# Patient Record
Sex: Female | Born: 1990 | Race: White | Hispanic: No | Marital: Married | Smoking: Never smoker
Health system: Southern US, Community
[De-identification: ages and names within clinical notes are randomized; demographics above are authoritative.]

## PROBLEM LIST (undated history)

## (undated) DIAGNOSIS — N946 Dysmenorrhea, unspecified: Secondary | ICD-10-CM

## (undated) DIAGNOSIS — O99891 Other specified diseases and conditions complicating pregnancy: Secondary | ICD-10-CM

## (undated) DIAGNOSIS — S0990XA Unspecified injury of head, initial encounter: Secondary | ICD-10-CM

## (undated) DIAGNOSIS — N200 Calculus of kidney: Secondary | ICD-10-CM

## (undated) HISTORY — DX: Dysmenorrhea, unspecified: N94.6

---
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
## 2012-11-30 IMAGING — CR DG CERVICAL SPINE 2 OR 3 VIEWS
2 series · 2 of 2 positions shown · non-contrast
Comparison: None.

CLINICAL DATA: Neck pain, dizziness, headache

CERVICAL SPINE - 2-3 VIEW

[w c-spine lat]
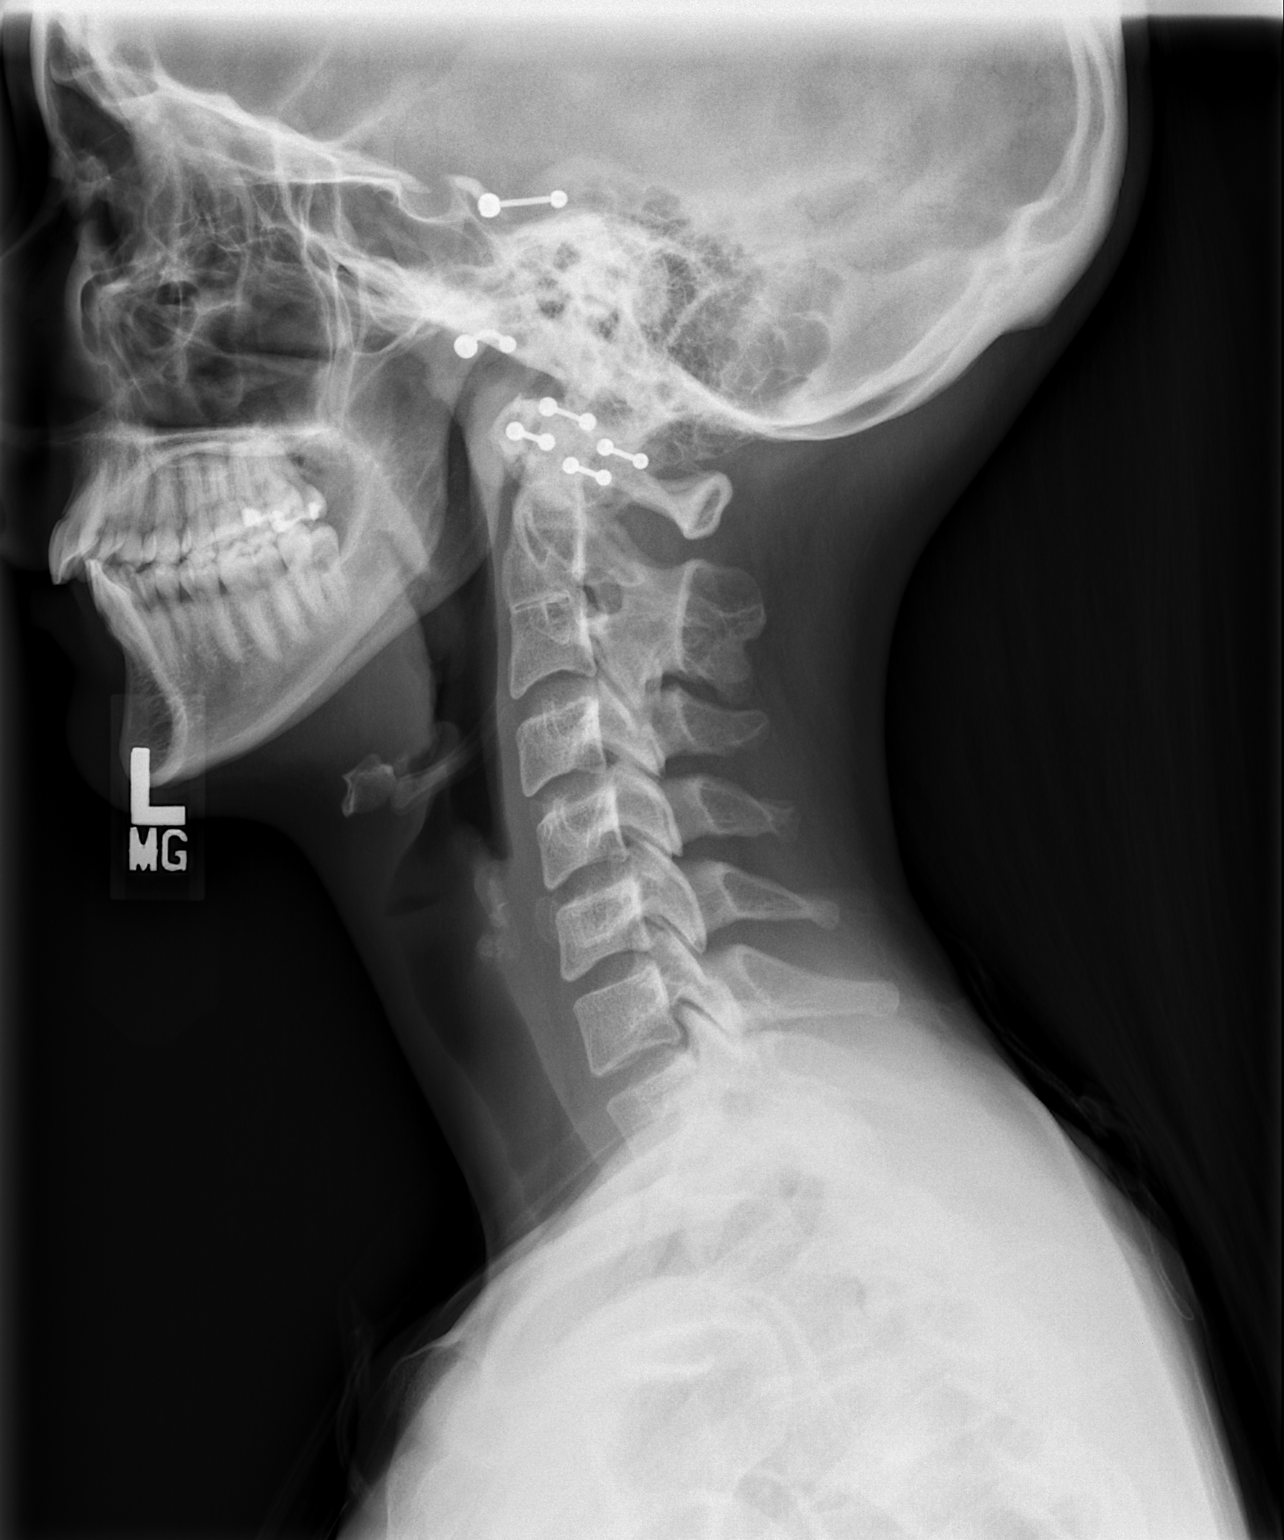

[w c-spine a.p. *]
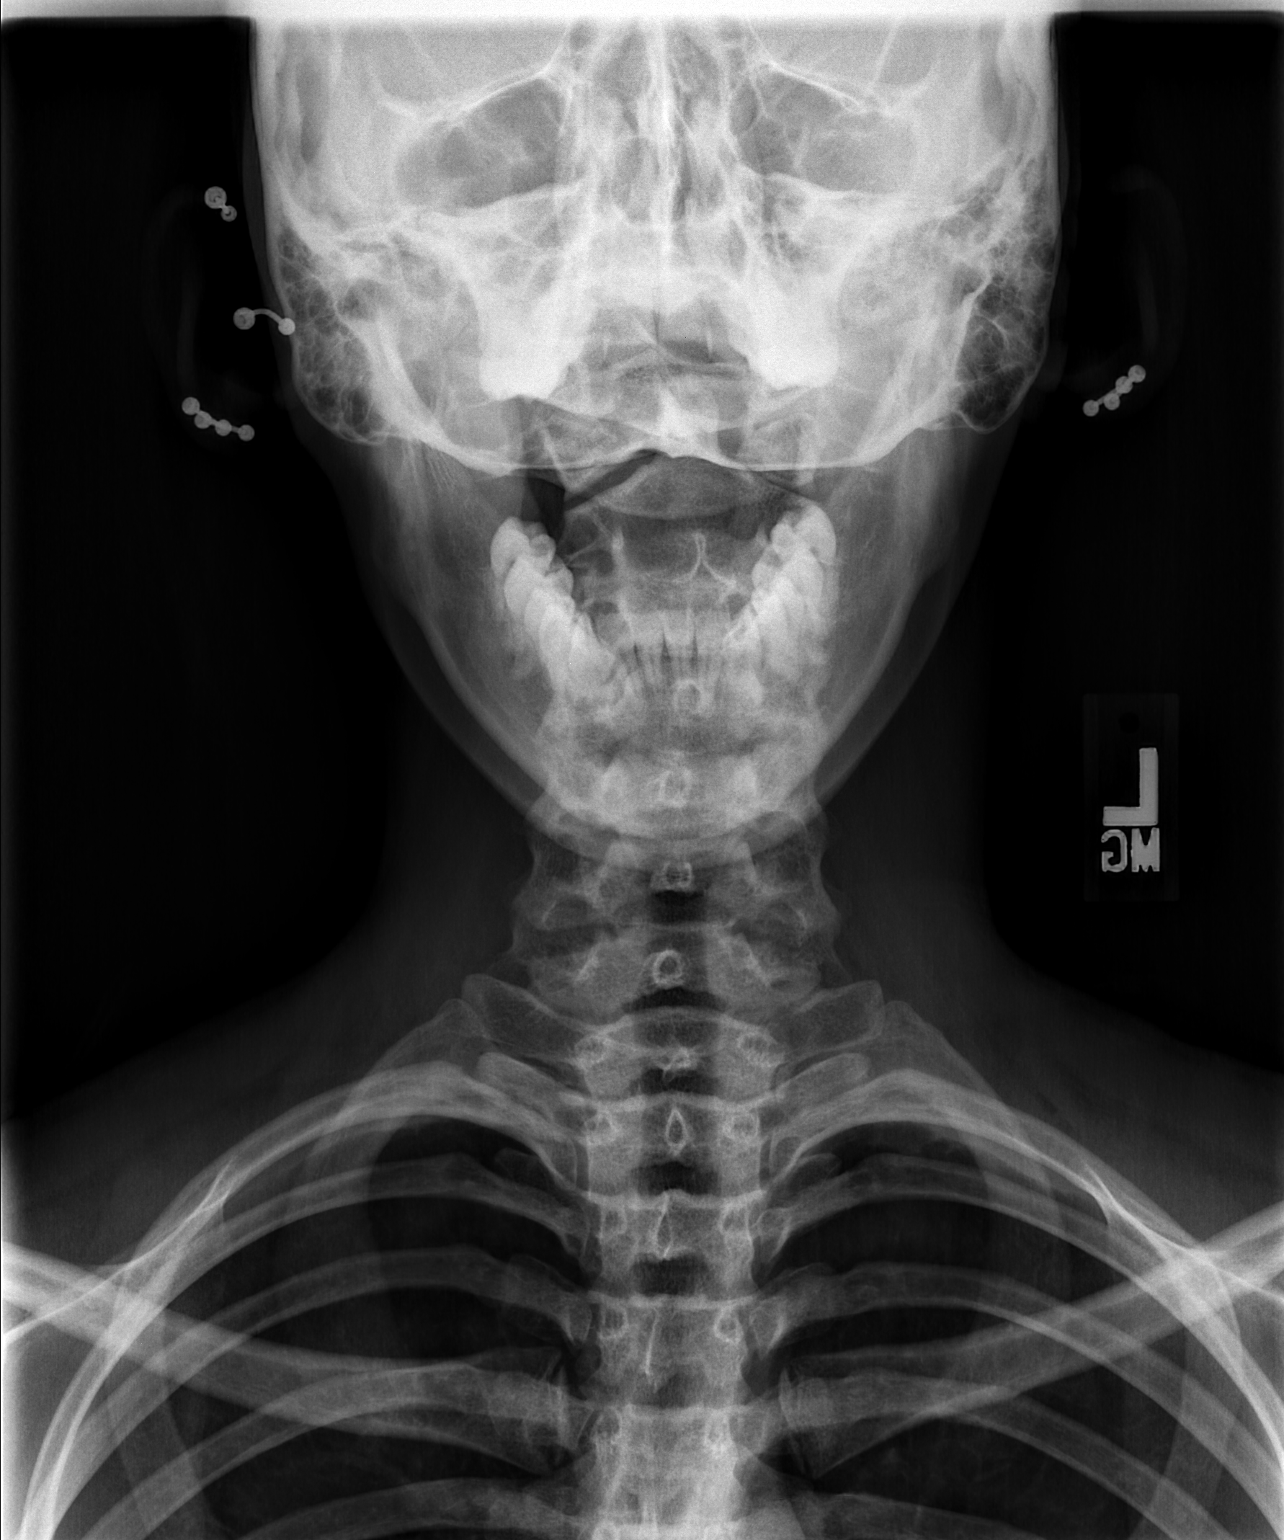

[2 of 2 positions shown; findings below may reference images not displayed]

FINDINGS: The cervical vertebrae are very slightly straightened in
alignment.  Intervertebral disc spaces appear normal.  No
prevertebral soft tissue swelling is seen.  There is apparent
congenital fusion of C2-3.  The lung apices are clear.
IMPRESSION: There is slightly straightened alignment with no acute abnormality.
Probable congenital fusion of C2-3.

## 2013-03-29 ENCOUNTER — Ambulatory Visit: Payer: 59 | Admitting: Sports Medicine

## 2013-11-23 ENCOUNTER — Encounter: Payer: Self-pay | Admitting: Women's Health

## 2013-12-06 ENCOUNTER — Other Ambulatory Visit (HOSPITAL_COMMUNITY)
Admission: RE | Admit: 2013-12-06 | Discharge: 2013-12-06 | Disposition: A | Discharge status: Home / Self Care | Payer: BC Managed Care – PPO | Source: Ambulatory Visit | Attending: Gynecology | Admitting: Gynecology

## 2013-12-06 ENCOUNTER — Encounter: Payer: Self-pay | Admitting: Women's Health

## 2013-12-06 ENCOUNTER — Ambulatory Visit (INDEPENDENT_AMBULATORY_CARE_PROVIDER_SITE_OTHER): Payer: BC Managed Care – PPO | Admitting: Women's Health

## 2013-12-06 VITALS — BP 112/72 | Ht 66.5 in | Wt 125.8 lb

## 2013-12-06 DIAGNOSIS — Z01419 Encounter for gynecological examination (general) (routine) without abnormal findings: Secondary | ICD-10-CM

## 2013-12-06 DIAGNOSIS — N946 Dysmenorrhea, unspecified: Secondary | ICD-10-CM

## 2013-12-06 DIAGNOSIS — Z3041 Encounter for surveillance of contraceptive pills: Secondary | ICD-10-CM

## 2013-12-06 LAB — CBC WITH DIFFERENTIAL/PLATELET
Basophils Absolute: 0 10*3/uL (ref 0.0–0.1)
Basophils Relative: 0 % (ref 0–1)
Eosinophils Absolute: 0.1 10*3/uL (ref 0.0–0.7)
Eosinophils Relative: 1 % (ref 0–5)
HEMATOCRIT: 36 % (ref 36.0–46.0)
HEMOGLOBIN: 12.2 g/dL (ref 12.0–15.0)
LYMPHS ABS: 1.7 10*3/uL (ref 0.7–4.0)
LYMPHS PCT: 19 % (ref 12–46)
MCH: 28.1 pg (ref 26.0–34.0)
MCHC: 33.9 g/dL (ref 30.0–36.0)
MCV: 82.9 fL (ref 78.0–100.0)
MONO ABS: 1 10*3/uL (ref 0.1–1.0)
MONOS PCT: 11 % (ref 3–12)
NEUTROS ABS: 6.2 10*3/uL (ref 1.7–7.7)
Neutrophils Relative %: 69 % (ref 43–77)
Platelets: 271 10*3/uL (ref 150–400)
RBC: 4.34 MIL/uL (ref 3.87–5.11)
RDW: 13.5 % (ref 11.5–15.5)
WBC: 9 10*3/uL (ref 4.0–10.5)

## 2013-12-06 MED ORDER — NORGESTIM-ETH ESTRAD TRIPHASIC 0.18/0.215/0.25 MG-35 MCG PO TABS: 1.0000 | ORAL_TABLET | Freq: Every day | ORAL | Status: DC

## 2013-12-06 NOTE — Progress Notes (Signed)
Judy Ramsey 03/01/1991 [id number]    History:    Presents for annual exam.  Regular monthly cycle with dysmenorrhea. Had been on Ortho Tri-Cyclen with good relief of dysmenorrhea but stopped. Gardasil series completed. Had been sexually active first partner both.  Past medical history, past surgical history, family history and social history were all reviewed and documented in the EPIC chart. Senior at [employer],youth ministry.   ROS:  A  12 point ROS was performed and pertinent positives and negatives are included.  Exam:  Filed Vitals:   12/06/13 0853  BP: 112/72    General appearance:  Normal Thyroid:  Symmetrical, normal in size, without palpable masses or nodularity. Respiratory  Auscultation:  Clear without wheezing or rhonchi Cardiovascular  Auscultation:  Regular rate, without rubs, murmurs or gallops  Edema/varicosities:  Not grossly evident Abdominal  Soft,nontender, without masses, guarding or rebound.  Liver/spleen:  No organomegaly noted  Hernia:  None appreciated  Skin  Inspection:  Grossly normal   Breasts: Examined lying and sitting.     Right: Without masses, retractions, discharge or axillary adenopathy.     Left: Without masses, retractions, discharge or axillary adenopathy. Gentitourinary   Inguinal/mons:  Normal without inguinal adenopathy  External genitalia:  Normal  BUS/Urethra/Skene's glands:  Normal  Vagina:  Normal  Cervix:  Normal  Uterus:   normal in size, shape and contour.  Midline and mobile  Adnexa/parametria:     Rt: Without masses or tenderness.   Lt: Without masses or tenderness.  Anus and perineum: Normal    Assessment/Plan:  23 y.o. SWF G0 for annual exam with dysmenorrhea.  Monthly cycle with dysmenorrhea  Plan: Options reviewed, Ortho Tri-Cyclen prescription, proper use, slight risk for blood clots and strokes reviewed. Start up instructions reviewed. Reviewed importance of condoms if sexually active. SBE's, regular  exercise, calcium rich diet, MVI daily encouraged. Campus safety reviewed. CBC, UA, Pap. New screening guidelines reviewed.  Note: This dictation was prepared with Dragon/digital dictation.  Any transcriptional errors that result are unintentional. Harrington ChallengerYOUNG,Judy J South Pointe Surgical CenterWHNP, 9:54 AM 12/06/2013

## 2013-12-06 NOTE — Patient Instructions (Signed)

## 2013-12-07 LAB — URINALYSIS W MICROSCOPIC + REFLEX CULTURE
BILIRUBIN URINE: NEGATIVE
CRYSTALS: NONE SEEN
Casts: NONE SEEN
GLUCOSE, UA: NEGATIVE mg/dL
Hgb urine dipstick: NEGATIVE
Ketones, ur: NEGATIVE mg/dL
Nitrite: NEGATIVE
PH: 5.5 (ref 5.0–8.0)
Protein, ur: NEGATIVE mg/dL
SPECIFIC GRAVITY, URINE: 1.025 (ref 1.005–1.030)
Urobilinogen, UA: 0.2 mg/dL (ref 0.0–1.0)

## 2013-12-07 LAB — CYTOLOGY - PAP

## 2013-12-08 LAB — URINE CULTURE
Colony Count: NO GROWTH
Organism ID, Bacteria: NO GROWTH

## 2014-05-15 ENCOUNTER — Other Ambulatory Visit: Payer: Self-pay

## 2014-05-15 DIAGNOSIS — Z3041 Encounter for surveillance of contraceptive pills: Secondary | ICD-10-CM

## 2014-05-15 MED ORDER — NORGESTIM-ETH ESTRAD TRIPHASIC 0.18/0.215/0.25 MG-35 MCG PO TABS: 1.0000 | ORAL_TABLET | Freq: Every day | ORAL | Status: DC

## 2014-11-13 ENCOUNTER — Other Ambulatory Visit: Payer: Self-pay

## 2014-11-13 DIAGNOSIS — Z3041 Encounter for surveillance of contraceptive pills: Secondary | ICD-10-CM

## 2014-11-13 MED ORDER — NORGESTIM-ETH ESTRAD TRIPHASIC 0.18/0.215/0.25 MG-35 MCG PO TABS: 1.0000 | ORAL_TABLET | Freq: Every day | ORAL | Status: DC

## 2015-02-02 ENCOUNTER — Other Ambulatory Visit: Payer: Self-pay

## 2015-02-02 DIAGNOSIS — Z3041 Encounter for surveillance of contraceptive pills: Secondary | ICD-10-CM

## 2015-02-05 ENCOUNTER — Other Ambulatory Visit: Payer: Self-pay

## 2015-02-05 DIAGNOSIS — Z3041 Encounter for surveillance of contraceptive pills: Secondary | ICD-10-CM

## 2015-03-12 ENCOUNTER — Other Ambulatory Visit: Payer: Self-pay | Admitting: *Deleted

## 2015-03-12 DIAGNOSIS — Z3041 Encounter for surveillance of contraceptive pills: Secondary | ICD-10-CM

## 2015-03-12 MED ORDER — NORGESTIM-ETH ESTRAD TRIPHASIC 0.18/0.215/0.25 MG-35 MCG PO TABS: 1.0000 | ORAL_TABLET | Freq: Every day | ORAL | Status: DC

## 2015-03-12 NOTE — Telephone Encounter (Signed)
Annual on 04/11/15

## 2015-04-11 ENCOUNTER — Encounter: Payer: Self-pay | Admitting: Women's Health

## 2018-12-06 LAB — OB RESULTS CONSOLE ABO/RH

## 2018-12-06 LAB — OB RESULTS CONSOLE ANTIBODY SCREEN: Antibody Screen: POSITIVE

## 2019-01-31 LAB — OB RESULTS CONSOLE HGB/HCT, BLOOD
HCT: 29 (ref 29–41)
Hemoglobin: 9.7

## 2019-01-31 LAB — OB RESULTS CONSOLE PLATELET COUNT: Platelets: 281

## 2019-02-28 LAB — OB RESULTS CONSOLE HIV ANTIBODY (ROUTINE TESTING): HIV: NONREACTIVE

## 2019-02-28 LAB — OB RESULTS CONSOLE PLATELET COUNT: Platelets: 272

## 2019-02-28 LAB — OB RESULTS CONSOLE RPR: RPR: NONREACTIVE

## 2019-02-28 LAB — OB RESULTS CONSOLE HGB/HCT, BLOOD
HCT: 30 (ref 29–41)
Hemoglobin: 10.2

## 2019-03-14 ENCOUNTER — Encounter: Payer: Self-pay | Admitting: General Practice

## 2019-03-22 ENCOUNTER — Encounter: Payer: Self-pay | Admitting: General Practice

## 2019-03-29 ENCOUNTER — Other Ambulatory Visit: Payer: Self-pay | Admitting: *Deleted

## 2019-03-29 ENCOUNTER — Encounter: Payer: Self-pay | Admitting: *Deleted

## 2019-03-29 DIAGNOSIS — Z3400 Encounter for supervision of normal first pregnancy, unspecified trimester: Secondary | ICD-10-CM | POA: Insufficient documentation

## 2019-03-29 HISTORY — DX: Encounter for supervision of normal first pregnancy, unspecified trimester: Z34.00

## 2019-03-30 ENCOUNTER — Ambulatory Visit (INDEPENDENT_AMBULATORY_CARE_PROVIDER_SITE_OTHER): Payer: 59 | Admitting: Certified Nurse Midwife

## 2019-03-30 ENCOUNTER — Encounter: Payer: Self-pay | Admitting: Certified Nurse Midwife

## 2019-03-30 ENCOUNTER — Other Ambulatory Visit: Payer: Self-pay

## 2019-03-30 DIAGNOSIS — Z3400 Encounter for supervision of normal first pregnancy, unspecified trimester: Secondary | ICD-10-CM

## 2019-03-30 DIAGNOSIS — O99013 Anemia complicating pregnancy, third trimester: Secondary | ICD-10-CM

## 2019-03-30 DIAGNOSIS — O99019 Anemia complicating pregnancy, unspecified trimester: Secondary | ICD-10-CM

## 2019-03-30 DIAGNOSIS — Z3A31 31 weeks gestation of pregnancy: Secondary | ICD-10-CM

## 2019-03-30 DIAGNOSIS — D509 Iron deficiency anemia, unspecified: Secondary | ICD-10-CM

## 2019-03-30 HISTORY — DX: Iron deficiency anemia, unspecified: D50.9

## 2019-03-30 HISTORY — DX: Anemia complicating pregnancy, unspecified trimester: O99.019

## 2019-03-30 NOTE — Patient Instructions (Signed)

## 2019-03-30 NOTE — Progress Notes (Signed)
History:   Judy Ramsey is a 28 y.o. G1P0 at 51w6dby early ultrasound being seen today for her first obstetrical visit. [facility] under media tab.  Her obstetrical history is significant for iron deficiency anemia during pregnancy. Patient does intend to breast feed. Pregnancy history fully reviewed.  Patient reports no complaints.  [place]. Prefers midwife and plans to have birth plan. She denies problems or complications during this pregnancy.      HISTORY: OB History  Gravida Para Term Preterm AB Living  1 0 0 0 0 0  SAB TAB Ectopic Multiple Live Births  0 0 0 0 0    # Outcome Date GA Lbr Len/2nd Weight Sex Delivery Anes PTL Lv  1 Current             Last pap smear was done 09/2018 and was normal  Past Medical History:  Diagnosis Date  . Dysmenorrhea   . Head injury   . Medical history non-contributory    Past Surgical History:  Procedure Laterality Date  . NO PAST SURGERIES     Family History  Problem Relation Age of Onset  . Hypertension Paternal Grandmother    Social History   Tobacco Use  . Smoking status: Never Smoker  . Smokeless tobacco: Never Used  Substance Use Topics  . Alcohol use: No  . Drug use: No   Allergies  Allergen Reactions  . Sulfa Antibiotics   . Ultram [Tramadol Hcl]    Current Outpatient Medications on File Prior to Visit  Medication Sig Dispense Refill  . amitriptyline (ELAVIL) 25 MG tablet Take 1 tablet (25 mg total) by mouth at bedtime. 15 tablet 0  . Norgestimate-Ethinyl Estradiol Triphasic (ORTHO TRI-CYCLEN, 28,) 0.18/0.215/0.25 MG-35 MCG tablet Take 1 tablet by mouth daily. 1 Package 0  . PredniSONE 5 MG KIT Take 1 kit (5 mg total) by mouth daily after breakfast. Prednisone 5 mg 6 day dosepack.  Take as directed. 1 kit 0   No current facility-administered medications on file prior to visit.      Review of Systems Pertinent items noted in HPI and remainder of comprehensive ROS otherwise negative. Physical Exam:   Vitals:   03/30/19 0844  BP: 109/66  Pulse: 94  Temp: 97.7 F (36.5 C)  Weight: 152 lb (68.9 kg)   Fetal Heart Rate (bpm): 141 Uterus:  Fundal Height: 29 cm  System: General: well-developed, well-nourished female in no acute distress   Breasts:  normal appearance, no masses or tenderness bilaterally   Skin: normal coloration and turgor, no rashes   Neurologic: oriented, normal, negative, normal mood   Extremities: normal strength, tone, and muscle mass, ROM of all joints is normal   HEENT PERRLA, extraocular movement intact and sclera clear   Mouth/Teeth mucous membranes moist, pharynx normal without lesions and dental hygiene good   Neck supple and no masses   Cardiovascular: regular rate and rhythm   Respiratory:  no respiratory distress, normal breath sounds   Abdomen: soft, gravid smaller for gestational age, non-tender; bowel sounds normal; no masses,  no organomegaly     Assessment:    Pregnancy: G1P0 Patient Active Problem List   Diagnosis Date Noted  . Iron deficiency anemia during pregnancy 03/30/2019  . Supervision of normal first pregnancy, antepartum 03/29/2019  . Dysmenorrhea 12/06/2013     Plan:  1. Supervision of normal first pregnancy, antepartum - Welcomed to practice and introduced self to patient. - Educated and discussed use of babyscripts app and taking BP weekly- BP cuff received from office today  - Discussed use of MyChart integration for prenatal appointments due to COVID-19- discussed future prenatal appointments and the timing of appointments  - Anticipatory guidance on upcoming appointments and what to expect with pregnancy over the next 4 weeks  - Educated and discussed BH contractions, warning signs and reasons to call office or present to MAU for evaluation  - Answered patient's questions  - Encouraged to bring birth  plan to next appointment  - Discussed pediatricians - patient wants more holistic practice that does not push vaccinations  - Enroll Patient in Babyscripts  2. Iron deficiency anemia during pregnancy - Currently taking OTC iron supplementation  - Hgb at 28 weeks according to prenatal records is 10.2 on 02/28/2019  - Patient reports taking [place] iron supplements     Prenatal records reviewed  Continue prenatal vitamins. Patient declined genetic screening  Anatomy US completed  Problem list reviewed and updated. The nature of [place] with multiple MDs and other Advanced Practice Providers was explained to patient; also emphasized that residents, students are part of our team. Routine obstetric precautions reviewed. Return in about 29 days (around 04/28/2019) for ROB/GBS.      Lajean Manes, [place] for [employer], [place]

## 2019-04-05 ENCOUNTER — Inpatient Hospital Stay (HOSPITAL_COMMUNITY)
Admission: AD | Admit: 2019-04-05 | Discharge: 2019-04-05 | Disposition: A | Discharge status: Home / Self Care | Payer: 59 | Attending: Obstetrics & Gynecology | Admitting: Obstetrics & Gynecology

## 2019-04-05 ENCOUNTER — Telehealth: Payer: Self-pay | Admitting: *Deleted

## 2019-04-05 ENCOUNTER — Encounter (HOSPITAL_COMMUNITY): Payer: Self-pay | Admitting: Obstetrics & Gynecology

## 2019-04-05 ENCOUNTER — Other Ambulatory Visit: Payer: Self-pay

## 2019-04-05 ENCOUNTER — Inpatient Hospital Stay (HOSPITAL_COMMUNITY): Payer: 59

## 2019-04-05 DIAGNOSIS — O26831 Pregnancy related renal disease, first trimester: Secondary | ICD-10-CM | POA: Diagnosis not present

## 2019-04-05 DIAGNOSIS — R109 Unspecified abdominal pain: Secondary | ICD-10-CM

## 2019-04-05 DIAGNOSIS — Z3A32 32 weeks gestation of pregnancy: Secondary | ICD-10-CM | POA: Diagnosis not present

## 2019-04-05 DIAGNOSIS — N200 Calculus of kidney: Secondary | ICD-10-CM

## 2019-04-05 DIAGNOSIS — M549 Dorsalgia, unspecified: Secondary | ICD-10-CM | POA: Insufficient documentation

## 2019-04-05 DIAGNOSIS — Z882 Allergy status to sulfonamides status: Secondary | ICD-10-CM | POA: Insufficient documentation

## 2019-04-05 DIAGNOSIS — Z881 Allergy status to other antibiotic agents status: Secondary | ICD-10-CM | POA: Insufficient documentation

## 2019-04-05 DIAGNOSIS — O26893 Other specified pregnancy related conditions, third trimester: Secondary | ICD-10-CM | POA: Diagnosis not present

## 2019-04-05 DIAGNOSIS — R112 Nausea with vomiting, unspecified: Secondary | ICD-10-CM | POA: Diagnosis not present

## 2019-04-05 DIAGNOSIS — N132 Hydronephrosis with renal and ureteral calculous obstruction: Secondary | ICD-10-CM | POA: Insufficient documentation

## 2019-04-05 DIAGNOSIS — O26833 Pregnancy related renal disease, third trimester: Secondary | ICD-10-CM | POA: Diagnosis not present

## 2019-04-05 LAB — URINALYSIS, ROUTINE W REFLEX MICROSCOPIC
Bilirubin Urine: NEGATIVE
Glucose, UA: NEGATIVE mg/dL
Hgb urine dipstick: NEGATIVE
Ketones, ur: >80 mg/dL — AB
Leukocytes,Ua: NEGATIVE
Nitrite: NEGATIVE
Protein, ur: NEGATIVE mg/dL
Specific Gravity, Urine: 1.025 (ref 1.005–1.030)
pH: 6.5 (ref 5.0–8.0)

## 2019-04-05 MED ORDER — PROMETHAZINE HCL 25 MG PO TABS: 25.0000 mg | ORAL_TABLET | Freq: Four times a day (QID) | ORAL | 0 refills | Status: DC | PRN

## 2019-04-05 MED ORDER — CYCLOBENZAPRINE HCL 10 MG PO TABS
10.0000 mg | ORAL_TABLET | Freq: Once | ORAL | Status: AC
  Administered 2019-04-05: 10 mg via ORAL
  Filled 2019-04-05: qty 1

## 2019-04-05 MED ORDER — OXYCODONE HCL 5 MG PO TABS: 5.0000 mg | ORAL_TABLET | Freq: Four times a day (QID) | ORAL | 0 refills | Status: AC | PRN

## 2019-04-05 NOTE — MAU Note (Signed)
Not enough urine for culture tube 

## 2019-04-05 NOTE — Discharge Instructions (Signed)

## 2019-04-05 NOTE — MAU Provider Note (Signed)
Chief Complaint:  Abdominal Pain, Back Pain, Emesis, and Nausea   First Provider Initiated Contact with Patient 04/05/19 1543     HPI: Judy Ramsey is a 28 y.o. G1P0 at 31w5dwho presents to maternity admissions reporting back pain & n/v. Reports left sided back pain since this morning. Initially pain was intermittent but over the last several hours pain has become constant and more severe. Pain does not radiate. Nothing makes better or worse. Since the pain became worse she has been vomiting. States she hasn't been able to keep down water or tylenol due to vomiting. Also has had intermittent abdominal cramping since severe back pain and vomiting. Denies fever/chills, dysuria, vaginal bleeding, hematuria, or LOF. Good fetal movement. No recent intercourse. Has had increased urinary urgency & hasn't been able to void much today.   Location: back Quality: cramping Severity: 9/10 in pain scale Duration: <1 day Timing: constant Modifying factors: none Associated signs and symptoms: vomiting  Pregnancy Course: [facility]  Past Medical History:  Diagnosis Date  . Dysmenorrhea   . Head injury    OB History  Gravida Para Term Preterm AB Living  1            SAB TAB Ectopic Multiple Live Births               # Outcome Date GA Lbr Len/2nd Weight Sex Delivery Anes PTL Lv  1 Current            Past Surgical History:  Procedure Laterality Date  . BUNIONECTOMY  2018   right foot   Family History  Problem Relation Age of Onset  . Hypertension Paternal Grandmother   . Hypertension Mother    Social History   Tobacco Use  . Smoking status: Never Smoker  . Smokeless tobacco: Never Used  Substance Use Topics  . Alcohol use: No  . Drug use: No   Allergies  Allergen Reactions  . Pumpkin Flavor Hives  . Sulfa Antibiotics Hives  . Ultram [Tramadol Hcl]    No medications prior to admission.    I have reviewed patient's Past Medical Hx, Surgical Hx, Family Hx, Social Hx, medications and  allergies.   ROS:  Review of Systems  Constitutional: Negative.   Gastrointestinal: Positive for abdominal pain, nausea and vomiting. Negative for constipation and diarrhea.  Genitourinary: Positive for decreased urine volume, flank pain and frequency. Negative for dysuria, hematuria, vaginal bleeding and vaginal discharge.  Musculoskeletal: Positive for back pain.    Physical Exam   Patient Vitals for the past 24 hrs:  BP Temp Temp src Pulse Resp SpO2 Weight  04/05/19 1849 120/71 98.6 F (37 C) Oral (!) 107 20 100 % --  04/05/19 1440 117/62 97.9 F (36.6 C) Oral (!) 108 18 100 % 68.6 kg    Constitutional: Well-developed, well-nourished female. Pt visibly uncomfortable.  Cardiovascular: normal rate & rhythm, no murmur Respiratory: normal effort, lung sounds clear throughout GI: Positive left CVAT. Abd soft, non-tender, gravid appropriate for gestational age. Pos BS x 4 MS: Extremities nontender, no edema, normal ROM Neurologic: Alert and oriented x 4.  GU:      Pelvic: NEFG, physiologic discharge, no blood, cervix clean.   Dilation: Closed Effacement (%): Thick Cervical Position: Middle Station: -3 Exam by:: EJorje GuildNP  NST:  Baseline: 135 bpm, Variability: Good {> 6 bpm), Accelerations: Reactive and Decelerations: Absent   Labs: Results for orders placed or performed during the hospital encounter of 04/05/19 (from the past 24  hour(s))  Urinalysis, Routine w reflex microscopic     Status: Abnormal   Collection Time: 04/05/19  3:02 PM  Result Value Ref Range   Color, Urine YELLOW YELLOW   APPearance CLEAR CLEAR   Specific Gravity, Urine 1.025 1.005 - 1.030   pH 6.5 5.0 - 8.0   Glucose, UA NEGATIVE NEGATIVE mg/dL   Hgb urine dipstick NEGATIVE NEGATIVE   Bilirubin Urine NEGATIVE NEGATIVE   Ketones, ur >80 (A) NEGATIVE mg/dL   Protein, ur NEGATIVE NEGATIVE mg/dL   Nitrite NEGATIVE NEGATIVE   Leukocytes,Ua NEGATIVE NEGATIVE    Imaging:  US Renal  Result Date:  04/05/2019 CLINICAL DATA:  Initial evaluation for acute left flank pain. Thirty-two weeks pregnant. EXAM: RENAL / URINARY TRACT ULTRASOUND COMPLETE COMPARISON:  None. FINDINGS: Right Kidney: Renal measurements: 10.5 x 6.0 x 6.4 cm = volume: 210.8 mL. Echogenicity within normal limits. Mild hydronephrosis. No visible nephrolithiasis. No focal renal mass. Left Kidney: Renal measurements: 11.2 x 5.4 x 5.9 cm = volume: 187.7 mL. Echogenicity within normal limits. Mild left hydronephrosis. 6 mm nonobstructive stone present at the lower pole. No focal renal mass. Bladder: Not visualized. Other: None. IMPRESSION: 1. Mild bilateral hydronephrosis, likely related to current pregnancy. 2. 6 mm nonobstructive left renal nephrolithiasis. Electronically Signed   By: Jeannine Boga M.D.   On: 04/05/2019 18:34    MAU Course: Orders Placed This Encounter  Procedures  . US Renal  . Urinalysis, Routine w reflex microscopic  . Discharge patient   Meds ordered this encounter  Medications  . cyclobenzaprine (FLEXERIL) tablet 10 mg  . promethazine (PHENERGAN) 25 MG tablet    Sig: Take 1 tablet (25 mg total) by mouth every 6 (six) hours as needed for nausea or vomiting.    Dispense:  30 tablet    Refill:  0    Order Specific Question:   Supervising Provider    Answer:   [place] Mode [[id number]]  . oxyCODONE (ROXICODONE) 5 MG immediate release tablet    Sig: Take 1 tablet (5 mg total) by mouth every 6 (six) hours as needed for up to 5 days for breakthrough pain.    Dispense:  20 tablet    Refill:  0    Order Specific Question:   Supervising Provider    Answer:   [place] Mode [[id number]]    MDM: Reactive NST with some contractions. Cervix closed/thick  VSS & pt afebrile. Some CVAT on left side. U/a negative for signs of infection & no hemoglobin. Does appear dehydrated.  Offered pain medication & IV fluids. Pt declines but ultimately accepted some flexeril & attempted PO hydration. Continues to have severe  pain, will get renal ultrasound.   Renal ultrasound shows 6 mm left stone. Mild hydronephrosis bilaterally. No evidence of obstruction.       Discussed pain management at home. Push fluids. F/u with urology. Will defer flomax at this time due to sulfa allergy.                                   Assessment: 1. Pregnancy with nephrolithiasis in third trimester   2. Acute left flank pain   3. [redacted] weeks gestation of pregnancy     Plan: Discharge home in stable condition.  Rx phenergan & oxycodone Take tylenol prn Oral hydration F/u with urology Discussed reasons to return to MAU  Follow-up [facility].  Schedule an appointment as soon as possible for a visit.   Contact information: [facility] Follow up.   Specialty: Obstetrics and Gynecology Why: return for worsening symptoms Contact information: [address] [id number] [place] [phone]          Allergies as of 04/05/2019      Reactions   Pumpkin Flavor Hives   Sulfa Antibiotics Hives   Ultram [tramadol Hcl]       Medication List    STOP taking these medications   amitriptyline 25 MG tablet Commonly known as: ELAVIL   Norgestimate-Ethinyl Estradiol Triphasic 0.18/0.215/0.25 MG-35 MCG tablet Commonly known as: Ortho Tri-Cyclen (28)   PredniSONE 5 MG Kit     TAKE these medications   oxyCODONE 5 MG immediate release tablet Commonly known as: Roxicodone Take 1 tablet (5 mg total) by mouth every 6 (six) hours as needed for up to 5 days for breakthrough pain.   prenatal multivitamin Tabs tablet Take 1 tablet by mouth daily at 12 noon.   promethazine 25 MG tablet Commonly known as: PHENERGAN Take 1 tablet (25 mg total) by mouth every 6 (six) hours as needed for nausea or vomiting.       Jorje Guild, NP 04/05/2019 7:54 PM

## 2019-04-05 NOTE — Telephone Encounter (Signed)
Patient called stating she is having a lot a back pain that is getting worse. Patient stated she sleeps on her left side a lot and has not tried taking Tylenol. Advised patient to try extra strength Tylenol, taking a warm bath with epsom salt, using a body pillow at night, alternating right/left side sleeping and using a maternity support belt during the day. Patient voiced understanding.  Derl Barrow, RN

## 2019-04-05 NOTE — Telephone Encounter (Signed)
Patient's husband called clinic stating that patient is having severe back pain and now vomiting. Advised him to take patient to MAU for further evaluation.  Derl Barrow, RN

## 2019-04-05 NOTE — MAU Note (Signed)
Woke up fine, then started getting a cramp in lower left back, getting worse, is now a constant pain, has started cramping in abd every once in a while.  Pain is making her nauseated.

## 2019-04-08 NOTE — Telephone Encounter (Signed)
Patient called with concerns of having constipation and pain related to kidney stone. Pt stated she has not had a bowel movement in about 6 days. She also was starting to vomit again. Asked if the phenergan helped, she said she took the medication about 20 minutes to calling the office. I asked if MAU gave her a referral to call a urologist. She stated she was told not to call the urologist until after the pregnancy. Advised patient to give them a call anyway to see what options they have for her. She stated she tried the other options for constipation but didn't work. Advised patient to try Dulcolax suppository. If she stills severe pain, no bowel movement and vomiting to go back to MAU. Will forward to midwife for further advise.  Derl Barrow, RN

## 2019-04-09 ENCOUNTER — Other Ambulatory Visit: Payer: Self-pay | Admitting: Certified Nurse Midwife

## 2019-04-09 DIAGNOSIS — O219 Vomiting of pregnancy, unspecified: Secondary | ICD-10-CM

## 2019-04-09 DIAGNOSIS — O26833 Pregnancy related renal disease, third trimester: Secondary | ICD-10-CM

## 2019-04-09 DIAGNOSIS — N200 Calculus of kidney: Secondary | ICD-10-CM

## 2019-04-09 MED ORDER — ONDANSETRON 4 MG PO TBDP: 4.0000 mg | ORAL_TABLET | Freq: Three times a day (TID) | ORAL | 1 refills | Status: DC | PRN

## 2019-04-22 NOTE — L&D Delivery Note (Addendum)
OB/GYN Faculty Practice Delivery Note  Judy Ramsey is a 29 y.o. G1P0 s/p SVD at [redacted]w[redacted]d. She was admitted for PROM.   ROM: 52h 5m with clear fluid GBS Status:  Negative/-- (01/14 1111)  Labor Progress: Patient presented to L&D for PROM. Initial SVE: 1cm. She then progressed to complete.   Delivery Date/Time: 05/15/2019 @ 2042 Delivery: Called to room and patient was complete and pushing. Head delivered without difficulty. Nuchal cord present x 1. Shoulder delivery with gentle downward traction and body delivered in usual fashion. Infant with spontaneous cry, placed on mother's abdomen, dried and stimulated. Cord clamped x 2 after 1-minute delay, and cut by FOB. Cord blood drawn. Placenta delivered spontaneously with gentle cord traction. Fundus firm with massage and Pitocin. Labia, perineum, vagina, and cervix inspected inspected, first degree vaginal laceration present and repair with 3-0 vicryl.  Mother and baby stable and bonding.   Placenta: Sent to L&D Complications: None Lacerations: first degree vaginal EBL: 200 Analgesia: None   Infant: APGAR (1 MIN): 8   APGAR (5 MINS): 9    Sherlyn Lees, SNM, RNC-OB    I confirm that I have verified the information documented in the nurse midwife student's note and that I have also personally reperformed the history, physical exam and all medical decision making activities of this service and have verified that all service and findings are accurately documented in this student's note.   I was gloved and present for entire delivery Delivery was uneventful and tolerated well by patient No difficulty with shoulders Placenta delivered spontaneously and grossly intact Perineum as described above. Suturing indicated as above. Agree with delivery note above.   Raelyn Mora, CNM 05/15/2019 9:16 PM

## 2019-04-27 ENCOUNTER — Ambulatory Visit (INDEPENDENT_AMBULATORY_CARE_PROVIDER_SITE_OTHER): Payer: 59

## 2019-04-27 ENCOUNTER — Other Ambulatory Visit: Payer: Self-pay

## 2019-04-27 VITALS — BP 108/73 | HR 73 | Wt 155.2 lb

## 2019-04-27 DIAGNOSIS — Z3400 Encounter for supervision of normal first pregnancy, unspecified trimester: Secondary | ICD-10-CM

## 2019-04-27 DIAGNOSIS — Z3403 Encounter for supervision of normal first pregnancy, third trimester: Secondary | ICD-10-CM

## 2019-04-27 DIAGNOSIS — Z3A35 35 weeks gestation of pregnancy: Secondary | ICD-10-CM

## 2019-04-27 NOTE — Patient Instructions (Signed)
Places to have your son circumcised:                                                                      [facility]     [phone]   $480 while you are in hospital         [facility]              [phone]   $[phone] by 4 wks                      [facility]                     [phone]   $[phone] by 7 days [facility]                    [phone]   $269 by 4 wks [facility]             [phone]   $225 by 2 wks    These prices sometimes change but are roughly what you can expect to pay. Please call and confirm pricing.   Circumcision is considered an elective/non-medically necessary procedure. There are many reasons parents decide to have their sons circumsized. During the first year of life circumcised males have a reduced risk of urinary tract infections but after this year the rates between circumcised males and uncircumcised males are the same.  It is safe to have your son circumcised outside of the hospital and the places above perform them regularly.   Deciding about Circumcision in Baby Boys  (Up-to-date The Basics)  What is circumcision?   Circumcision is a surgery that removes the skin that covers the tip of the penis, called the "foreskin" Circumcision is usually done when a boy is between 1 and 10 days old. In the [place], circumcision is common. In some other countries, fewer boys are circumcised. Circumcision is a common tradition in some religions.  Should I have my baby boy circumcised?   There is no easy answer. Circumcision has some benefits. But it also has risks. After talking with your doctor, you will have to decide for yourself what is right for your family.  What are the benefits of circumcision?   Circumcised boys seem to have slightly lower rates of: ?Urinary tract infections ?Swelling of the opening at the tip of the penis Circumcised men seem to have slightly lower rates of: ?Urinary tract infections ?Swelling of the opening at the tip of the penis ?Penis cancer ?HIV  and other infections that you catch during sex ?Cervical cancer in the women they have sex with Even so, in the [place], the risks of these problems are small - even in boys and men who have not been circumcised. Plus, boys and men who are not circumcised can reduce these extra risks by: ?Cleaning their penis well ?Using condoms during sex  What are the risks of circumcision?  Risks include: ?Bleeding or infection from the surgery ?Damage to or amputation of the penis ?A chance that the doctor will cut off too much or not enough of the foreskin ?A chance that sex won't feel as good later in life Only about 1 out of every 200   circumcisions leads to problems. There is also a chance that your health insurance won't pay for circumcision.  How is circumcision done in baby boys?  First, the baby gets medicine for pain relief. This might be a cream on the skin or a shot into the base of the penis. Next, the doctor cleans the baby's penis well. Then he or she uses special tools to cut off the foreskin. Finally, the doctor wraps a bandage (called gauze) around the baby's penis. If you have your baby circumcised, his doctor or nurse will give you instructions on how to care for him after the surgery. It is important that you follow those instructions carefully.  

## 2019-04-27 NOTE — Progress Notes (Signed)
   PRENATAL VISIT NOTE  Subjective:  Judy Ramsey is a 29 y.o. G1P0 at [redacted]w[redacted]d who presents today for routine prenatal care.  She is currently being monitored for supervision of a low-risk pregnancy with problems as listed below.  Patient has no pregnancy related concerns and endorses fetal movement.  She denies vaginal concerns including discharge, bleeding, leaking, itching, and burning. She reports some "cramping" type pain that she feels may be contractions.  She further reports that the pain from her kidney stone subsided ~ 4 days after diagnosis.  However, patient is unsure of whether or not she has passed the stone.  She denies issues with urination.  Patient does not desire contraception and has secured a pediatrician who is "laxed" with vaccinations.  Patient does desire a circumcision for her infant son.    Patient Active Problem List   Diagnosis Date Noted  . Iron deficiency anemia during pregnancy 03/30/2019  . Supervision of normal first pregnancy, antepartum 03/29/2019  . Dysmenorrhea 12/06/2013    The following portions of the patient's history were reviewed and updated as appropriate: allergies, current medications, past family history, past medical history, past social history, past surgical history and problem list. Problem list updated.  Objective:   Vitals:   04/27/19 1342  BP: 108/73  Pulse: 73  Weight: 155 lb 3.2 oz (70.4 kg)    Fetal Status: Fetal Heart Rate (bpm): 140 Fundal Height: 34 cm Movement: Present     General:  Alert, oriented and cooperative. Patient is in no acute distress.  Skin: Skin is warm and dry.   Cardiovascular: Regular rate and rhythm.  Respiratory: Normal respiratory effort. CTA-Bilaterally  Abdomen: Soft, gravid, appropriate for gestational age.  Pelvic: Cervical exam deferred        Extremities: Normal range of motion.  Edema: Trace  Mental Status: Normal mood and affect. Normal behavior. Normal judgment and thought content.    Assessment and Plan:  Pregnancy: G1P0 at [redacted]w[redacted]d  1. Supervision of normal first pregnancy, antepartum -Reviewed current GA and discussed how it would be beneficial to receive GBS culture at later time as first time pregnancy likely to go over due date.  -Informed that cervical exam would be deferred due to GA. -Educated on inability to find Hepatitis B testing in previous ob records and will collect today. -Discussed releasing of results to mychart. -Discussed and reviewed postpartum planning including contraception, pediatricians, and infant feedings and circumcision desires/costs. -Extensive discussion regarding birth plans including standardization of delayed cord clamping and need to sign release for refusal of Vitamin K, Erythromycin, and Hep B for infant.  Further informed that some MDs may defer circumcision if Vitamin K is not administered.   -Encouraged to formulate birth plan that acts as a reminder of patient's desire that she and husband can refer to during stress of labor and delivery. -Plan to return to office in one week for GBS with cervical exam if desired.   Preterm labor symptoms and general obstetric precautions including but not limited to vaginal bleeding, contractions, leaking of fluid and fetal movement were reviewed with the patient.  Please refer to After Visit Summary for other counseling recommendations.  Return in about 1 week (around 05/04/2019) for LR-ROB with GBS.  Future Appointments  Date Time Provider Department Center  05/05/2019 10:10 AM Raelyn Mora, CNM [facility] None    Cherre Robins, CNM 04/27/2019, 2:29 PM

## 2019-04-27 NOTE — Progress Notes (Signed)
36 week labs at next appointment

## 2019-04-28 LAB — HEPATITIS B SURFACE ANTIGEN: Hepatitis B Surface Ag: NEGATIVE

## 2019-05-03 ENCOUNTER — Telehealth: Payer: Self-pay | Admitting: General Practice

## 2019-05-03 NOTE — Telephone Encounter (Signed)
Called [facility] to check on status of referral.  Patient was left a couple of messages to return call to schedule.  Patient has not returned the referred office to schedule appointment.

## 2019-05-05 ENCOUNTER — Other Ambulatory Visit: Payer: Self-pay

## 2019-05-05 ENCOUNTER — Ambulatory Visit (INDEPENDENT_AMBULATORY_CARE_PROVIDER_SITE_OTHER): Payer: 59 | Admitting: Obstetrics and Gynecology

## 2019-05-05 ENCOUNTER — Encounter: Payer: Self-pay | Admitting: Obstetrics and Gynecology

## 2019-05-05 VITALS — BP 112/79 | HR 97 | Temp 98.0°F | Wt 157.0 lb

## 2019-05-05 DIAGNOSIS — O1203 Gestational edema, third trimester: Secondary | ICD-10-CM

## 2019-05-05 DIAGNOSIS — N898 Other specified noninflammatory disorders of vagina: Secondary | ICD-10-CM

## 2019-05-05 DIAGNOSIS — Z113 Encounter for screening for infections with a predominantly sexual mode of transmission: Secondary | ICD-10-CM | POA: Diagnosis not present

## 2019-05-05 DIAGNOSIS — Z3A37 37 weeks gestation of pregnancy: Secondary | ICD-10-CM

## 2019-05-05 DIAGNOSIS — Z3400 Encounter for supervision of normal first pregnancy, unspecified trimester: Secondary | ICD-10-CM

## 2019-05-05 LAB — OB RESULTS CONSOLE GBS: GBS: NEGATIVE

## 2019-05-05 NOTE — Progress Notes (Signed)
   LOW-RISK PREGNANCY OFFICE VISIT Patient name: Samaya Boardley MRN [id number]  Date of birth: 02-04-1991 Chief Complaint:   No chief complaint on file.  History of Present Illness:   Cordella Nyquist is a 29 y.o. G1P0 female at [redacted]w[redacted]d with an Estimated Date of Delivery: 05/26/19 being seen today for ongoing management of a low-risk pregnancy.  Today she reports increased swelling of ankles and feet bilaterally. Contractions: Not present. Vag. Bleeding: None.  Movement: Present. denies leaking of fluid. Review of Systems:   Pertinent items are noted in HPI Denies abnormal vaginal discharge w/ itching/odor/irritation, headaches, visual changes, shortness of breath, chest pain, abdominal pain, severe nausea/vomiting, or problems with urination or bowel movements unless otherwise stated above. Pertinent History Reviewed:  Reviewed past medical,surgical, social, obstetrical and family history.  Reviewed problem list, medications and allergies. Physical Assessment:   Vitals:   05/05/19 1026  BP: 112/79  Pulse: 97  Temp: 98 F (36.7 C)  Weight: 157 lb (71.2 kg)  Body mass index is 24.96 kg/m.        Physical Examination:   General appearance: Well appearing, and in no distress  Mental status: Alert, oriented to person, place, and time  Skin: Warm & dry  Cardiovascular: Normal heart rate noted  Respiratory: Normal respiratory effort, no distress  Abdomen: Soft, gravid, nontender  Pelvic: Cervical exam performed  Dilation: Closed Effacement (%): Thick Station: -3  Extremities: Edema: Mild pitting, slight indentation  Fetal Status: Fetal Heart Rate (bpm): 145 Fundal Height: 37 cm Movement: Present Presentation: Vertex  No results found for this or any previous visit (from the past 24 hour(s)).  Assessment & Plan:  1) Low-risk pregnancy G1P0 at [redacted]w[redacted]d with an Estimated Date of Delivery: 05/26/19   2) Supervision of normal first pregnancy, antepartum  - Culture, beta strep (group b only),  -  Cervicovaginal ancillary only( [place])  3) Edema during pregnancy in third trimester  - Advised to increase daily water intake and elevate extremities anytime she is sitting (with the exception of driving)   Meds: No orders of the defined types were placed in this encounter.  Labs/procedures today: GBS and GC/CT  Plan:  Continue routine obstetrical care   Reviewed: Term labor symptoms and general obstetric precautions including but not limited to vaginal bleeding, contractions, leaking of fluid and fetal movement were reviewed in detail with the patient.  All questions were answered. Has home bp cuff. Check bp weekly, let us know if >140/90.   Follow-up: Return in about 1 week (around 05/12/2019) for Return OB - My Chart video.  Orders Placed This Encounter  Procedures  . Culture, beta strep (group b only)   Raelyn Mora MSN, CNM 05/05/2019 11:01 AM

## 2019-05-06 LAB — CERVICOVAGINAL ANCILLARY ONLY
Bacterial Vaginitis (gardnerella): NEGATIVE
Candida Glabrata: NEGATIVE
Candida Vaginitis: NEGATIVE
Chlamydia: NEGATIVE
Comment: NEGATIVE
Comment: NEGATIVE
Comment: NEGATIVE
Comment: NEGATIVE
Comment: NEGATIVE
Comment: NORMAL
Neisseria Gonorrhea: NEGATIVE
Trichomonas: NEGATIVE

## 2019-05-09 LAB — CULTURE, BETA STREP (GROUP B ONLY): Strep Gp B Culture: NEGATIVE

## 2019-05-10 ENCOUNTER — Telehealth: Payer: Self-pay | Admitting: General Practice

## 2019-05-10 NOTE — Telephone Encounter (Signed)
Left message on VM informing patient that appt that is scheduled on Thursday, 05/12/2019 at 10:00am via Mychar video, has been rescheduled to 9:10am due to a cancellation.  Asked patient to give our office a call with any questions or concerns.

## 2019-05-12 ENCOUNTER — Telehealth (INDEPENDENT_AMBULATORY_CARE_PROVIDER_SITE_OTHER): Payer: 59 | Admitting: Obstetrics and Gynecology

## 2019-05-12 ENCOUNTER — Other Ambulatory Visit: Payer: Self-pay

## 2019-05-12 DIAGNOSIS — O99019 Anemia complicating pregnancy, unspecified trimester: Secondary | ICD-10-CM | POA: Diagnosis not present

## 2019-05-12 DIAGNOSIS — Z3400 Encounter for supervision of normal first pregnancy, unspecified trimester: Secondary | ICD-10-CM

## 2019-05-12 DIAGNOSIS — D509 Iron deficiency anemia, unspecified: Secondary | ICD-10-CM

## 2019-05-12 DIAGNOSIS — Z3A38 38 weeks gestation of pregnancy: Secondary | ICD-10-CM

## 2019-05-14 ENCOUNTER — Encounter: Payer: Self-pay | Admitting: Certified Nurse Midwife

## 2019-05-14 ENCOUNTER — Encounter (HOSPITAL_COMMUNITY): Payer: Self-pay | Admitting: Obstetrics & Gynecology

## 2019-05-14 ENCOUNTER — Inpatient Hospital Stay (HOSPITAL_COMMUNITY)
Admission: AD | Admit: 2019-05-14 | Discharge: 2019-05-17 | DRG: 805 | Disposition: A | Discharge status: Home / Self Care | Payer: 59 | Attending: Family Medicine | Admitting: Family Medicine

## 2019-05-14 ENCOUNTER — Other Ambulatory Visit: Payer: Self-pay

## 2019-05-14 DIAGNOSIS — O2662 Liver and biliary tract disorders in childbirth: Secondary | ICD-10-CM | POA: Diagnosis present

## 2019-05-14 DIAGNOSIS — O4292 Full-term premature rupture of membranes, unspecified as to length of time between rupture and onset of labor: Principal | ICD-10-CM | POA: Diagnosis present

## 2019-05-14 DIAGNOSIS — O26893 Other specified pregnancy related conditions, third trimester: Secondary | ICD-10-CM | POA: Diagnosis present

## 2019-05-14 DIAGNOSIS — Z3A38 38 weeks gestation of pregnancy: Secondary | ICD-10-CM | POA: Diagnosis not present

## 2019-05-14 DIAGNOSIS — Z20822 Contact with and (suspected) exposure to covid-19: Secondary | ICD-10-CM | POA: Diagnosis present

## 2019-05-14 DIAGNOSIS — D509 Iron deficiency anemia, unspecified: Secondary | ICD-10-CM | POA: Diagnosis present

## 2019-05-14 DIAGNOSIS — K831 Obstruction of bile duct: Secondary | ICD-10-CM | POA: Diagnosis present

## 2019-05-14 DIAGNOSIS — O4290 Premature rupture of membranes, unspecified as to length of time between rupture and onset of labor, unspecified weeks of gestation: Secondary | ICD-10-CM

## 2019-05-14 DIAGNOSIS — O9902 Anemia complicating childbirth: Secondary | ICD-10-CM | POA: Diagnosis present

## 2019-05-14 DIAGNOSIS — Z3400 Encounter for supervision of normal first pregnancy, unspecified trimester: Secondary | ICD-10-CM

## 2019-05-14 DIAGNOSIS — O4212 Full-term premature rupture of membranes, onset of labor more than 24 hours following rupture: Secondary | ICD-10-CM | POA: Diagnosis not present

## 2019-05-14 HISTORY — DX: Other specified diseases and conditions complicating pregnancy: O99.891

## 2019-05-14 HISTORY — DX: Calculus of kidney: N20.0

## 2019-05-14 LAB — CBC
HCT: 32.9 % — ABNORMAL LOW (ref 36.0–46.0)
Hemoglobin: 11 g/dL — ABNORMAL LOW (ref 12.0–15.0)
MCH: 29.5 pg (ref 26.0–34.0)
MCHC: 33.4 g/dL (ref 30.0–36.0)
MCV: 88.2 fL (ref 80.0–100.0)
Platelets: 250 10*3/uL (ref 150–400)
RBC: 3.73 MIL/uL — ABNORMAL LOW (ref 3.87–5.11)
RDW: 13.2 % (ref 11.5–15.5)
WBC: 13.9 10*3/uL — ABNORMAL HIGH (ref 4.0–10.5)
nRBC: 0 % (ref 0.0–0.2)

## 2019-05-14 LAB — WET PREP, GENITAL
Clue Cells Wet Prep HPF POC: NONE SEEN
Sperm: NONE SEEN
Trich, Wet Prep: NONE SEEN
Yeast Wet Prep HPF POC: NONE SEEN

## 2019-05-14 LAB — TYPE AND SCREEN
ABO/RH(D): O POS
Antibody Screen: NEGATIVE

## 2019-05-14 LAB — RESPIRATORY PANEL BY RT PCR (FLU A&B, COVID)
Influenza A by PCR: NEGATIVE
Influenza B by PCR: NEGATIVE
SARS Coronavirus 2 by RT PCR: NEGATIVE

## 2019-05-14 MED ORDER — OXYTOCIN BOLUS FROM INFUSION
500.0000 mL | Freq: Once | INTRAVENOUS | Status: AC
  Administered 2019-05-15: 21:00:00 500 mL via INTRAVENOUS

## 2019-05-14 MED ORDER — OXYCODONE-ACETAMINOPHEN 5-325 MG PO TABS: 2.0000 | ORAL_TABLET | ORAL | Status: DC | PRN

## 2019-05-14 MED ORDER — OXYCODONE-ACETAMINOPHEN 5-325 MG PO TABS: 1.0000 | ORAL_TABLET | ORAL | Status: DC | PRN

## 2019-05-14 MED ORDER — ONDANSETRON HCL 4 MG/2ML IJ SOLN: 4.0000 mg | Freq: Four times a day (QID) | INTRAMUSCULAR | Status: DC | PRN

## 2019-05-14 MED ORDER — LIDOCAINE HCL (PF) 1 % IJ SOLN
30.0000 mL | INTRAMUSCULAR | Status: AC | PRN
  Administered 2019-05-15: 21:00:00 30 mL via SUBCUTANEOUS
  Filled 2019-05-14: qty 30

## 2019-05-14 MED ORDER — LACTATED RINGERS IV SOLN: 500.0000 mL | INTRAVENOUS | Status: DC | PRN

## 2019-05-14 MED ORDER — LACTATED RINGERS IV SOLN: INTRAVENOUS | Status: DC

## 2019-05-14 MED ORDER — SOD CITRATE-CITRIC ACID 500-334 MG/5ML PO SOLN: 30.0000 mL | ORAL | Status: DC | PRN

## 2019-05-14 MED ORDER — ACETAMINOPHEN 325 MG PO TABS: 650.0000 mg | ORAL_TABLET | ORAL | Status: DC | PRN

## 2019-05-14 MED ORDER — OXYTOCIN 40 UNITS IN NORMAL SALINE INFUSION - SIMPLE MED
2.5000 [IU]/h | INTRAVENOUS | Status: DC
  Filled 2019-05-14: qty 1000

## 2019-05-14 NOTE — MAU Provider Note (Signed)
S: Ms. Judy Ramsey is a 29 y.o. G1P0 at [redacted]w[redacted]d  who presents to MAU today complaining of leaking of fluid since 430 pm yesterday. She denies vaginal bleeding. She endorses contractions. She reports normal fetal movement.   The vaginal discharge has been watery, comes and goes in trickles. She has had to change her pantyliner three times today "because it always feels wet to me".   O: BP 126/82   Pulse (!) 104   Temp 98.3 F (36.8 C) (Oral)   Resp 16   Ht 5\' 6"  (1.676 m)   Wt 74 kg   LMP 08/09/2018 (Approximate)   SpO2 97%   BMI 26.33 kg/m  GENERAL: Well-developed, well-nourished female in no acute distress.  HEAD: Normocephalic, atraumatic.  CHEST: Normal effort of breathing, regular heart rate ABDOMEN: Soft, nontender, gravid PELVIC: Normal external female genitalia. Vagina is pink and rugated. Cervix with normal contour, no lesions. White clumpy discharge  questionable pooling.   Cervical exam:   1/soft/middle position/ballotable.    Fetal Monitoring: Baseline: 140 bpm Variability: mod  Accelerations: present Decelerations: decel Contractions: irregular which patient reports as uterine cramping.   No results found for this or any previous visit (from the past 24 hour(s)).  -will do sterile fern slide; if negative will send for amnisure.   A: SIUP at [redacted]w[redacted]d  Vaginal discharge is consistent with yeast, which may have produced pooling affect.  SROM based on sterile fern  P: Admit for labor and delivery  [redacted]w[redacted]d, CNM 05/14/2019 8:21 PM

## 2019-05-14 NOTE — H&P (Addendum)
OBSTETRIC ADMISSION HISTORY AND PHYSICAL  Shamika Pedregon is a 29 y.o. female G1P0 with IUP at [redacted]w[redacted]d by 7wk [place] presenting for SOL. She noted some leaking of fluid earlier today and came to be evaluated, fern positive and ctx. She reports +FMs, No LOF, no VB, no blurry vision, headaches or peripheral edema, and RUQ pain.  She plans on breast feeding. She will use condoms for birth control She received her prenatal care at [facility], transferred from [facility] during third trimester.  Dating: By 7wk [place] --->  Estimated Date of Delivery: 05/26/19  Sono:    @[redacted]w[redacted]d , CWD, normal anatomy, cephalic presentation, anterior placental lie, 314g @[redacted]w[redacted]d , CWD, normal anatomy, cephalic presentation, anterior placental lie, 630g, 48.3%   Prenatal History/Complications: Iron deficiency anemia during pregnancy  Past Medical History: Past Medical History:  Diagnosis Date  . Dysmenorrhea   . Head injury   . Kidney stone complicating pregnancy     Past Surgical History: Past Surgical History:  Procedure Laterality Date  . BUNIONECTOMY  2018   right foot    Obstetrical History: OB History    Gravida  1   Para      Term      Preterm      AB      Living        SAB      TAB      Ectopic      Multiple      Live Births              Social History: Social History   Socioeconomic History  . Marital status: Married    Spouse name: Not on file  . Number of children: Not on file  . Years of education: Not on file  . Highest education level: Not on file  Occupational History  . Not on file  Tobacco Use  . Smoking status: Never Smoker  . Smokeless tobacco: Never Used  Substance and Sexual Activity  . Alcohol use: No  . Drug use: No  . Sexual activity: Yes    Birth control/protection: None  Other Topics Concern  . Not on file  Social History Narrative  . Not on file   Social Determinants of Health   Financial Resource Strain:   . Difficulty of Paying Living Expenses:  Not on file  Food Insecurity:   . Worried About in the Last Year: Not on file  . Ran Out of Food in the Last Year: Not on file  Transportation Needs:   . Lack of Transportation (Medical): Not on file  . Lack of Transportation (Non-Medical): Not on file  Physical Activity:   . Days of Exercise per Week: Not on file  . Minutes of Exercise per Session: Not on file  Stress:   . Feeling of Stress : Not on file  Social Connections:   . Frequency of Communication with Friends and Family: Not on file  . Frequency of Social Gatherings with Friends and Family: Not on file  . Attends Religious Services: Not on file  . Active Member of Clubs or Organizations: Not on file  . Attends 2019 Meetings: Not on file  . Marital Status: Not on file    Family History: Family History  Problem Relation Age of Onset  . Hypertension Paternal Grandmother   . Hypertension Mother     Allergies: Allergies  Allergen Reactions  . Pumpkin Flavor Hives  . Sulfa Antibiotics Hives  .  Ultram [Tramadol Hcl]     Medications Prior to Admission  Medication Sig Dispense Refill Last Dose  . Prenatal Vit-Fe Fumarate-FA (PRENATAL MULTIVITAMIN) TABS tablet Take 1 tablet by mouth daily at 12 noon.   05/14/2019 at Unknown time  . ondansetron (ZOFRAN ODT) 4 MG disintegrating tablet Take 1 tablet (4 mg total) by mouth every 8 (eight) hours as needed for nausea or vomiting. 30 tablet 1      Review of Systems   All systems reviewed and negative except as stated in HPI  Blood pressure 126/82, pulse (!) 104, temperature 98.3 F (36.8 C), temperature source Oral, resp. rate 16, height 5\' 6"  (1.676 m), weight 74 kg, last menstrual period 08/09/2018, SpO2 97 %. General appearance: alert, cooperative, appears stated age and no distress Lungs: normal effort Heart: regular rate  Abdomen: soft, non-tender; bowel sounds normal Pelvic: gravid uterus GU: No vaginal lesions  Extremities:  Homans sign is negative, no sign of DVT DTR's intact Presentation: cephalic Fetal monitoringBaseline: 145 bpm, Variability: Good {> 6 bpm) and Accelerations: Reactive, Decelerations: absent Uterine activity: Frequency: Every 2-3 minutes     Prenatal labs: ABO, Rh: O/--/-- (08/17 0000) Antibody: Positive (08/17 0000) Rubella:  Immune RPR: Nonreactive (11/09 0000)  HBsAg: Negative (01/06 1421)  HIV: Non-reactive (11/09 0000)  GBS: Negative/-- (01/14 1111)  1 hr Glucola 61 Genetic screening  declined Anatomy 08-25-1997 WNL  Prenatal Transfer Tool  Maternal Diabetes: No Genetic Screening: Declined Maternal Ultrasounds/Referrals: Normal Fetal Ultrasounds or other Referrals:  None Maternal Substance Abuse:  No Significant Maternal Medications:  None Significant Maternal Lab Results: Group B Strep negative  Results for orders placed or performed during the hospital encounter of 05/14/19 (from the past 24 hour(s))  Wet prep, genital   Collection Time: 05/14/19  8:22 PM  Result Value Ref Range   Yeast Wet Prep HPF POC NONE SEEN NONE SEEN   Trich, Wet Prep NONE SEEN NONE SEEN   Clue Cells Wet Prep HPF POC NONE SEEN NONE SEEN   WBC, Wet Prep HPF POC MANY (A) NONE SEEN   Sperm NONE SEEN     Patient Active Problem List   Diagnosis Date Noted  . Iron deficiency anemia during pregnancy 03/30/2019  . Supervision of normal first pregnancy, antepartum 03/29/2019  . Dysmenorrhea 12/06/2013    Assessment/Plan:  Charitie Hinote is a 29 y.o. G1P0 at [redacted]w[redacted]d here for SOL with SROM.  #Labor: SROM with clear fluid likely afternoon of 1/22 at 1630, fern positive, regular ctx q2-16m. Discussed FB placement, patient will consider it. She desires no augmentation if possible. Shared decision making regarding risks and benefits of prolonged rupture of membranes discussed, including risks of infection, and patient expressed understanding. If no change by 4 AM, patient amenable to intervention. #Pain: Per  patient request #FWB: Cat 1 #ID:  GBS neg #MOF: breast #MOC: condoms #Circ:  Yes, inpatient  1m, MD [facility], PGY-1 05/14/2019, 8:45 PM    OB FELLOW ATTESTATION  I have seen and examined this patient and agree with above documentation in the resident's note except as noted below.  29 y/o G1 at [redacted]w[redacted]d now on L&D after ~30hr since ROM for clear. Discussed risks and benefits of expectant management in detail with patient, which is currently her preference. Discussed risks of chorio and how given have already had prolonged ROM would recommend immediate augmentation to avoid complications particularly with chorio. Patient undecided at present but we also discussed making a decision by 4AM  which would be 36hr since ROM if there has been no progress and she was amenable to that plan.  Remainder as above, in addition COVID swab has returned negative.   Augustin Coupe, MD/[facility] OB Fellow  05/14/2019, 11:29 PM

## 2019-05-14 NOTE — MAU Note (Signed)
Pt reports some leakage of fluid that started around 4:30pm yesterday. Pt reports it is a clear, watery discharge. Has had a liner on and it "always feels wet", but there have been several times when more comes out. Feels some irregular contractions. Denies vaginal bleeding. Reports good fetal movement. Cervix was checked last week and was closed.

## 2019-05-15 ENCOUNTER — Encounter (HOSPITAL_COMMUNITY): Payer: Self-pay | Admitting: Family Medicine

## 2019-05-15 ENCOUNTER — Encounter: Payer: Self-pay | Admitting: Obstetrics and Gynecology

## 2019-05-15 DIAGNOSIS — O4212 Full-term premature rupture of membranes, onset of labor more than 24 hours following rupture: Secondary | ICD-10-CM

## 2019-05-15 DIAGNOSIS — Z3A38 38 weeks gestation of pregnancy: Secondary | ICD-10-CM

## 2019-05-15 LAB — RPR: RPR Ser Ql: NONREACTIVE

## 2019-05-15 LAB — ABO/RH: ABO/RH(D): O POS

## 2019-05-15 MED ORDER — FENTANYL CITRATE (PF) 100 MCG/2ML IJ SOLN
INTRAMUSCULAR | Status: AC
  Administered 2019-05-15: 18:00:00 100 ug via INTRAVENOUS
  Filled 2019-05-15: qty 2

## 2019-05-15 MED ORDER — FENTANYL CITRATE (PF) 100 MCG/2ML IJ SOLN: 100.0000 ug | INTRAMUSCULAR | Status: DC | PRN

## 2019-05-15 MED ORDER — METHYLERGONOVINE MALEATE 0.2 MG/ML IJ SOLN
0.2000 mg | Freq: Once | INTRAMUSCULAR | Status: AC
  Administered 2019-05-15: 23:00:00 0.2 mg via INTRAMUSCULAR
  Filled 2019-05-15: qty 1

## 2019-05-15 MED ORDER — TERBUTALINE SULFATE 1 MG/ML IJ SOLN: 0.2500 mg | Freq: Once | INTRAMUSCULAR | Status: DC | PRN

## 2019-05-15 MED ORDER — OXYTOCIN 40 UNITS IN NORMAL SALINE INFUSION - SIMPLE MED
1.0000 m[IU]/min | INTRAVENOUS | Status: DC
  Administered 2019-05-15: 10:00:00 2 m[IU]/min via INTRAVENOUS

## 2019-05-15 NOTE — Progress Notes (Addendum)
Judy Ramsey is a 29 y.o. G1P0 at [redacted]w[redacted]d by LMP admitted for induction of labor due to cholestasis.  Subjective: Doing well.  Feeling some contractions.  No complaints  Objective: BP 123/73   Pulse 87   Temp 98.8 F (37.1 C) (Axillary)   Resp 18   Ht 5\' 6"  (1.676 m)   Wt 74 kg   LMP 08/09/2018 (Approximate)   SpO2 97%   BMI 26.33 kg/m  No intake/output data recorded. No intake/output data recorded.  FHT:  FHR: 135 bpm, variability: moderate,  accelerations:  Present,  decelerations:  Absent UC:   irregular, every 2-5 minutes SVE:   Dilation: 4 Effacement (%): 60, 70 Station: -2 Exam by:: [email or web address] CNM student  Labs: Lab Results  Component Value Date   WBC 13.9 (H) 05/14/2019   HGB 11.0 (L) 05/14/2019   HCT 32.9 (L) 05/14/2019   MCV 88.2 05/14/2019   PLT 250 05/14/2019    Assessment / Plan: IOL for cholestasis    -SVE, 4/50/-2 -Start pitocin at 2 milli units and increase by 2 milli units every 30 minutes to achieve contractions that are every 2-3 minutes and that are moderate in intensity. -Continuous monitoring. -SVE prn -May have epidural PRN   Labor:  Latent phase Preeclampsia:   Deines signs and symptoms Fetal Wellbeing:  Category I Pain Control:   Desires no epidural or IV pain medication I/D:  n/a Anticipated MOD:  NSVD  05/16/2019, SNM 05/15/2019, 10:57 AM   I confirm that I have verified the information documented in the nurse midwife student's note and that I have also personally reperformed the history, physical exam and all medical decision making activities of this service and have verified that all service and findings are accurately documented in this student's note.    05/17/2019, CNM 05/15/2019 12:51 PM

## 2019-05-15 NOTE — Discharge Summary (Signed)
Postpartum Discharge Summary     Patient Name: Judy Ramsey DOB: 1990-04-29 MRN: [id number]  Date of admission: 05/14/2019 Delivering Provider: Darlin Coco , SNM & Laury Deep, CNM  Date of discharge: 05/17/2019  Admitting diagnosis: Normal labor [O80, Z37.9] Intrauterine pregnancy: [redacted]w[redacted]d    Secondary diagnosis:  Active Problems:   Supervision of normal first pregnancy, antepartum   Iron deficiency anemia during pregnancy   PROM (premature rupture of membranes)  Additional problems: none     Discharge diagnosis: Term Pregnancy Delivered                                                                                                Post partum procedures:NA  Augmentation: AROM, Pitocin and Foley Balloon  Complications: None  Hospital course:  Onset of Labor With Vaginal Delivery     29y.o. yo G1P0 at 333w3das admitted in Latent Labor on 05/14/2019. Patient had an uncomplicated labor course as follows: Arrived with prolonged ROM. Initial SVE 1.5/50/-3. Received Foley bulb and Pitocin. Progressed to complete. Membrane Rupture Time/Date: 4:30 PM ,05/13/2019   Intrapartum Procedures: Episiotomy: None [1]                                         Lacerations:  1st degree [2];Perineal [11];Vaginal [6]  Patient had a delivery of a Viable infant. 05/15/2019  Information for the patient's newborn:  RuJohan, Antonacci0[[id number]]Delivery Method: Vaginal, Spontaneous(Filed from Delivery Summary)     Pateint had a postpartum course significant for several clots noted after delivery with fundal massage. Continued with each assessment over the recovery period. Methergine given due to persistent atony. On the following day she was stable and doing well, vitals were stable and her vaginal bleeding was wnl. She is ambulating, tolerating a regular diet, passing flatus, and urinating well. Patient is discharged home in stable condition on 05/17/19.  Delivery time: 8:42 PM     Magnesium Sulfate received: No BMZ received: No Rhophylac:No MMR:No Transfusion:No  Physical exam  Vitals:   05/16/19 0830 05/16/19 1200 05/16/19 2100 05/17/19 0510  BP: (!) 106/58 (!) 100/58 114/68 117/77  Pulse: 96 89 98 87  Resp: '18 16 18 16  ' Temp: 98.8 F (37.1 C) 98.8 F (37.1 C) 98.7 F (37.1 C) 98.5 F (36.9 C)  TempSrc: Oral Oral Oral   SpO2: 97%  100% 98%  Weight:      Height:       General: alert, cooperative and no distress Lochia: appropriate Uterine Fundus: firm Incision: N/A DVT Evaluation: No evidence of DVT seen on physical exam. Labs: Lab Results  Component Value Date   WBC 13.9 (H) 05/14/2019   HGB 11.0 (L) 05/14/2019   HCT 32.9 (L) 05/14/2019   MCV 88.2 05/14/2019   PLT 250 05/14/2019   No flowsheet data found.  Discharge instruction: per After Visit Summary and "Baby and Me Booklet".  After visit meds:  Allergies as of 05/17/2019  Reactions   Pumpkin Flavor Hives   Sulfa Antibiotics Hives   Ultram [tramadol Hcl] Hives      Medication List    STOP taking these medications   ondansetron 4 MG disintegrating tablet Commonly known as: Zofran ODT     TAKE these medications   acetaminophen 325 MG tablet Commonly known as: Tylenol Take 2 tablets (650 mg total) by mouth every 6 (six) hours as needed (for pain scale < 4).   ferrous sulfate 325 (65 FE) MG tablet Take 325 mg by mouth daily with breakfast.   ibuprofen 600 MG tablet Commonly known as: ADVIL Take 1 tablet (600 mg total) by mouth every 6 (six) hours.   prenatal multivitamin Tabs tablet Take 1 tablet by mouth daily at 12 noon.       Diet: routine diet  Activity: Advance as tolerated. Pelvic rest for 6 weeks.   Outpatient follow up:6 weeks Follow up Appt: Future Appointments  Date Time Provider [employer]  06/30/2019  1:10 PM Laury Deep, CNM [facility] None   Follow up Visit: Follow-up Information    [place]. Go in 6  week(s).   Specialty: Obstetrics and Gynecology Why: For virtual appointment Contact information: [place] [phone]           Please schedule this patient for Postpartum visit in: 6 weeks with the following provider: APP Virtual For C/S patients schedule nurse incision check in weeks 2 weeks: no Low risk pregnancy complicated by: none Delivery mode:  SVD Anticipated Birth Control:  Condoms PP Procedures needed: none  Schedule Integrated BH visit: no   Newborn Data: Live born female "Danne Baxter" Birth Weight: 3650g APGAR: 37, 9  Newborn Delivery   Birth date/time: 05/15/2019 20:42:00 Delivery type: Vaginal, Spontaneous      Baby Feeding: Breast Disposition:home with mother   05/17/2019 Chauncey Mann, MD

## 2019-05-15 NOTE — Progress Notes (Signed)
After delivery, several clots noted with fundal massage. Continued with each assessment over the recovery period. Methergine given due to persistent atony. Will continue to monitor.   Rolm Bookbinder, CNM 05/15/19 11:23 PM

## 2019-05-15 NOTE — Progress Notes (Signed)
Labor Progress Note Judy Ramsey is a 29 y.o. G1P0 at [redacted]w[redacted]d presented for SROM on 1/22. S: Feeling infrequent ctx but overall comfortable.   O:  BP 119/68   Pulse 77   Temp 98.4 F (36.9 C) (Oral)   Resp 17   Ht 5\' 6"  (1.676 m)   Wt 74 kg   LMP 08/09/2018 (Approximate)   SpO2 97%   BMI 26.33 kg/m  EFM: 125, moderate variability, pos accels, no decels, reactive TOCO: q8-84m  CVE: Dilation: 1.5 Effacement (%): 50 Cervical Position: Posterior Station: -3 Presentation: Vertex Exam by:: MD Judy Ramsey   A&P: 29 y.o. G1P0 [redacted]w[redacted]d here for SROM. #Labor: Patient desires expectant management. SROM occurred at 1/22 at 1600 and therefore now ruptured for about 36 hours. Dr. 2/22 and myself have both discussed risk of infection with longer periods of rupture. No cervical change since admission. Desires only foley bulb placement at this time which was placed manually. Recommended Cytotec and patient not amenable at this time. Discussed nipple stimulation and frequent position changes, walking around, etc. Patient understanding. Anticipate SVD.  #Pain: per patient request #FWB: Cat I, EFW 3200g #GBS negative  Judy Reese, MD 4:13 AM

## 2019-05-15 NOTE — Progress Notes (Signed)
   MY CHART VIDEO VIRTUAL OBSTETRICS VISIT ENCOUNTER NOTE  I connected with Judy Ramsey on 05/12/19 at  9:10 AM EST by My Chart video at home and verified that I am speaking with the correct person using two identifiers.   I discussed the limitations, risks, security and privacy concerns of performing an evaluation and management service by My Chart video and the availability of in person appointments. I also discussed with the patient that there may be a patient responsible charge related to this service. The patient expressed understanding and agreed to proceed.  Subjective:  Judy Ramsey is a 29 y.o. G1P0 at [redacted]w[redacted]d being followed for ongoing prenatal care.  She is currently monitored for the following issues for this low-risk pregnancy and has Dysmenorrhea; Supervision of normal first pregnancy, antepartum; and Iron deficiency anemia during pregnancy on their problem list.  Patient reports increased clear, white d/c for 2 days. She is unsure if it is amniotic fluid. She does not feel like it is urine. Reports fetal movement. Denies any contractions, bleeding or leaking of fluid.   The following portions of the patient's history were reviewed and updated as appropriate: allergies, current medications, past family history, past medical history, past social history, past surgical history and problem list.   Objective:   General:  Alert, oriented and cooperative.   Mental Status: Normal mood and affect perceived. Normal judgment and thought content.  Rest of physical exam deferred due to type of encounter  BP 116/73   Pulse 76   Wt 162 lb 3.2 oz (73.6 kg)   LMP 08/09/2018 (Approximate)   BMI 25.79 kg/m  **Done by patient's own at home BP cuff and scale  Assessment and Plan:  Pregnancy: G1P0 at [redacted]w[redacted]d  1. Iron deficiency anemia during pregnancy - Taking iron  2. Supervision of normal first pregnancy, antepartum - Advised to put on pantiliner and walk around the house for about an  hour. If pad is wet OR if the leaking happens again (sporadically or continuously), , go to MAU for SROM evaluation.  Term labor symptoms and general obstetric precautions including but not limited to vaginal bleeding, contractions, leaking of fluid and fetal movement were reviewed in detail with the patient.  I discussed the assessment and treatment plan with the patient. The patient was provided an opportunity to ask questions and all were answered. The patient agreed with the plan and demonstrated an understanding of the instructions. The patient was advised to call back or seek an in-person office evaluation/go to MAU at [facility] for any urgent or concerning symptoms. Please refer to After Visit Summary for other counseling recommendations.   I provided 10 minutes of non-face-to-face time during this encounter. There was 5 minutes of chart review time spent prior to this encounter. Total time spent = 15 minutes.  Return in about 1 week (around 05/19/2019) for Return OB visit - cervical check.  Future Appointments  Date Time Provider Department Center  05/19/2019  1:50 PM Raelyn Mora, CNM [facility] None    Raelyn Mora, CNM [facility] [employer]

## 2019-05-15 NOTE — Progress Notes (Signed)
Judy Ramsey is a 29 y.o. G1P0 at [redacted]w[redacted]d by LMP admitted for PROM.  Subjective: Doing well.  Contractions are stronger but is coping well.   Objective: BP 124/73   Pulse 81   Temp 97.8 F (36.6 C) (Axillary)   Resp 18   Ht 5\' 6"  (1.676 m)   Wt 74 kg   LMP 08/09/2018 (Approximate)   SpO2 97%   BMI 26.33 kg/m  No intake/output data recorded. Total I/O In: 913.8 [P.O.:120; I.V.:793.8] Out: -   FHT:  FHR: 135 bpm, variability: moderate,  accelerations:  Present,  decelerations:  Absent UC:   regular, every 2-3 minutes SVE:   Dilation: 4 Effacement (%): 70 Station: 0 Exam by:: [email or web address], SNM   Labs: Lab Results  Component Value Date   WBC 13.9 (H) 05/14/2019   HGB 11.0 (L) 05/14/2019   HCT 32.9 (L) 05/14/2019   MCV 88.2 05/14/2019   PLT 250 05/14/2019    Assessment / Plan: Admitted for PROM   -SVE 4/80/0 -Continue to titrate pitocin PRN -May have epidural PRN  Labor: Progressing normally Preeclampsia:  Denies signs and symptoms Fetal Wellbeing:  Category I Pain Control:  Does not desire pain medication or an epidural I/D:  n/a Anticipated MOD:  NSVD  [birth year]1977, SNM 05/15/2019, 5:00 PM

## 2019-05-16 MED ORDER — ONDANSETRON HCL 4 MG PO TABS: 4.0000 mg | ORAL_TABLET | ORAL | Status: DC | PRN

## 2019-05-16 MED ORDER — ACETAMINOPHEN 325 MG PO TABS: 650.0000 mg | ORAL_TABLET | ORAL | Status: DC | PRN

## 2019-05-16 MED ORDER — ONDANSETRON HCL 4 MG/2ML IJ SOLN: 4.0000 mg | INTRAMUSCULAR | Status: DC | PRN

## 2019-05-16 MED ORDER — TETANUS-DIPHTH-ACELL PERTUSSIS 5-2.5-18.5 LF-MCG/0.5 IM SUSP: 0.5000 mL | Freq: Once | INTRAMUSCULAR | Status: DC

## 2019-05-16 MED ORDER — COCONUT OIL OIL
1.0000 "application " | TOPICAL_OIL | Status: DC | PRN
  Administered 2019-05-16: 1 via TOPICAL

## 2019-05-16 MED ORDER — BENZOCAINE-MENTHOL 20-0.5 % EX AERO
1.0000 "application " | INHALATION_SPRAY | CUTANEOUS | Status: DC | PRN
  Administered 2019-05-16: 1 via TOPICAL
  Filled 2019-05-16: qty 56

## 2019-05-16 MED ORDER — SENNOSIDES-DOCUSATE SODIUM 8.6-50 MG PO TABS
2.0000 | ORAL_TABLET | ORAL | Status: DC
  Administered 2019-05-16: 01:00:00 2 via ORAL
  Filled 2019-05-16 (×2): qty 2

## 2019-05-16 MED ORDER — SIMETHICONE 80 MG PO CHEW: 80.0000 mg | CHEWABLE_TABLET | ORAL | Status: DC | PRN

## 2019-05-16 MED ORDER — DIPHENHYDRAMINE HCL 25 MG PO CAPS: 25.0000 mg | ORAL_CAPSULE | Freq: Four times a day (QID) | ORAL | Status: DC | PRN

## 2019-05-16 MED ORDER — DIBUCAINE (PERIANAL) 1 % EX OINT: 1.0000 "application " | TOPICAL_OINTMENT | CUTANEOUS | Status: DC | PRN

## 2019-05-16 MED ORDER — IBUPROFEN 600 MG PO TABS
600.0000 mg | ORAL_TABLET | Freq: Four times a day (QID) | ORAL | Status: DC
  Filled 2019-05-16 (×4): qty 1

## 2019-05-16 MED ORDER — PRENATAL MULTIVITAMIN CH
1.0000 | ORAL_TABLET | Freq: Every day | ORAL | Status: DC
  Administered 2019-05-16: 1 via ORAL
  Filled 2019-05-16: qty 1

## 2019-05-16 MED ORDER — WITCH HAZEL-GLYCERIN EX PADS: 1.0000 "application " | MEDICATED_PAD | CUTANEOUS | Status: DC | PRN

## 2019-05-16 NOTE — Progress Notes (Addendum)
Post Partum Day 1 Subjective: no complaints, up ad lib, voiding, tolerating PO and + flatus  Objective: Blood pressure 105/70, pulse 91, temperature 98.1 F (36.7 C), temperature source Oral, resp. rate 16, height 5\' 6"  (1.676 m), weight 74 kg, last menstrual period 08/09/2018, SpO2 100 %, unknown if currently breastfeeding.  Physical Exam:  General: alert, cooperative and no distress Lochia: appropriate Uterine Fundus: firm DVT Evaluation: No evidence of DVT seen on physical exam. Negative Homan's sign. No cords or calf tenderness.  Recent Labs    05/14/19 2122  HGB 11.0*  HCT 32.9*    Assessment/Plan: Judy Ramsey is a 29 y.o. / F who G1P1 is PPD#1 from a SVD at [redacted]w[redacted]d.  She feels well and is without pain. No concerns at this time.  Plan for discharge tomorrow, inpatient circumcision for baby, and planning for condoms as contraception as patient cites previous negative experiences with BC.   LOS: 2 days   [redacted]w[redacted]d Loeliger 05/16/2019, 7:55 AM   I saw and evaluated the patient. I agree with the findings and the plan of care as documented in the student's note. Doing well. Vitals stable. Condoms for discharge. Discussed and consented for circumcision and will plan to do later today. Plan for DC tomorrow.  05/18/2019, MD [facility] Family Medicine Fellow, [facility] for [facility]

## 2019-05-16 NOTE — Lactation Note (Signed)
This note was copied from a baby's chart. Lactation Consultation Note  Patient Name: Judy Ramsey Frommer [id number]'W Date: 05/16/2019   Initial visit at 13 hours of life. Mom is a P1 who had an unmedicated vaginal birth. Infant has fed once, but has had a number of breastfeeding attempts.   During my visit, infant was sleeping & was showing no feeding cues (tongue was noted to be resting on the roof of the mouth). I taught Mom hand expression & Dad finger-fed infant a small amount of colostrum. Infant was noted to have a good suck on Dad's finger. I encouraged parents to try and feed every couple of hours, but if [facility] didn't latch, then to repeat the above method in an attempt to entice infant.   Specifics of an asymmetric latch were shown via [employer]. Breastfeeding positions were also briefly discussed.  Parents were made aware of O/P services, breastfeeding support groups, and our phone # for post-discharge questions. I encouraged parents to continue with doing skin-to-skin.    Lurline Hare [facility] 05/16/2019, 10:30 AM

## 2019-05-17 DIAGNOSIS — O4290 Premature rupture of membranes, unspecified as to length of time between rupture and onset of labor, unspecified weeks of gestation: Secondary | ICD-10-CM

## 2019-05-17 HISTORY — DX: Premature rupture of membranes, unspecified as to length of time between rupture and onset of labor, unspecified weeks of gestation: O42.90

## 2019-05-17 MED ORDER — ACETAMINOPHEN 325 MG PO TABS: 650.0000 mg | ORAL_TABLET | Freq: Four times a day (QID) | ORAL | 0 refills | Status: DC | PRN

## 2019-05-17 MED ORDER — IBUPROFEN 600 MG PO TABS: 600.0000 mg | ORAL_TABLET | Freq: Four times a day (QID) | ORAL | 0 refills | Status: DC

## 2019-05-17 NOTE — Lactation Note (Signed)
This note was copied from a baby's chart. Lactation Consultation Note  Patient Name: Judy Ramsey [id number] Date: 05/17/2019 Reason for consult: Follow-up assessment  P1 mother whose infant is now 61 hours old.  This is an ETI at 38+3 weeks.  Mother had recently finished breast feeding, however, baby was still showing cues when I arrived.  Offered to assist with latching and mother accepted.  Reviewed breast feeding basics and assisted baby to latch in the cross cradle hold to the left breast easily.  Baby has a recessed chin and a tight suck on my gloved finger prior to latching.  Performed suck training and noticed he likes to keep his tongue up to the hard palate; relaxed him and allowed him to continue practicing sucking for approximately 10 minutes while speaking with mother about latching.  Showed her how to do a chin tug to help pull his lower jaw down for easier latching.  During feeding demonstrated breast compressions and gentle stimulation to keep him awake.  He responded nicely to shoulder stimulation.  When he began to tire I suggested mother burp him and awaken him.  Demonstrated effective burping with good results.  During this time, pediatrician arrived for her assessment.  Allowed baby to continue suck training during his assessment.  Pediatrician finished speaking with the family and I then assisted him to latch in the football hold on the right breast easily.  Demonstrated a good deep latch for parents to observe.  Proper positioning encouraged.    Engorgement prevention/treatment reviewed.  Mother has a manual pump and a DEBP for home use.  Discussed our OP LC services as needed.  Parents have private insurance and encouraged them to call for eligibility to receive an OP visit if needed.  Mother is using coconut oil for comfort; suggested using her EBM prior to coconut oil.  Parents have our OP phone number for questions after discharge; happy to be discharged home today.  Baby  will return for his first follow up visit tomorrow.     Maternal Data Formula Feeding for Exclusion: No Has patient been taught Hand Expression?: Yes Does the patient have breastfeeding experience prior to this delivery?: No  Feeding Feeding Type: Breast Fed  LATCH Score Latch: Grasps breast easily, tongue down, lips flanged, rhythmical sucking.  Audible Swallowing: A few with stimulation  Type of Nipple: Everted at rest and after stimulation  Comfort (Breast/Nipple): Soft / non-tender  Hold (Positioning): Assistance needed to correctly position infant at breast and maintain latch.  LATCH Score: 8  Interventions Interventions: Breast feeding basics reviewed;Assisted with latch;Skin to skin;Breast massage;Hand express;Breast compression;Adjust position;Hand pump;Coconut oil;Position options;Support pillows  Lactation Tools Discussed/Used Tools: Pump;Coconut oil WIC Program: No   Consult Status Consult Status: Complete Date: 05/17/19 Follow-up type: Call as needed    Irene Pap Romy Ipock 05/17/2019, 9:56 AM

## 2019-05-17 NOTE — Lactation Note (Signed)
This note was copied from a baby's chart. Lactation Consultation Note  Patient Name: Judy Ramsey [id number] Date: 05/17/2019 Reason for consult: Difficult latch;Mother's request;1st time breastfeeding;Early term 37-38.6wks P1, 31hour ETI infant. Per parents , since circumcision earlier today infant has not been latching at breast, mom has been hand expressing and giving infant EBM by spoon. Per dad, infant has been very sleepy. Mom attempted to latch infant on left breast using cross cradle hold, infant was reluctant to latch when mom was doing STS, sucks bottom chin and hold tongue to roof of mouth. LC attempted to do suck training but infant would not suckle on gloved finger. Mom hand expressed 5 mls of colostrum that was spoon feed to infant, due to infant being 30 hours of age, RN will discussed  with parents possible donor milk supplement, due infant currently not latching at breast. Mom will start using DEBP to help with breast stimulation and to establish milk supply, mom will give infant back any EBM and can mix it with donor breast milk. Mom knows to use DEBP every 3 hours for 15 minutes on initial setting. Mom will continue to work towards latching infant at breast, will ask RN or LC to help with latch assistance. Mom knows to breastfeed infant according to hunger cues, 8 to 12 times within 24 hours and not exceed 3 hours without breastfeeding infant. If infant doesn't latch, mom will use DEBP and hand express giving infant back volume and supplement with donor milk if mom choose or formula based on infant's age/ hours of life (sheet given). Parents will continue to do as much STS as possible.     Maternal Data    Feeding    LATCH Score Latch: Too sleepy or reluctant, no latch achieved, no sucking elicited.  Audible Swallowing: None  Type of Nipple: Everted at rest and after stimulation  Comfort (Breast/Nipple): Soft / non-tender  Hold (Positioning): Assistance needed  to correctly position infant at breast and maintain latch.  LATCH Score: 5  Interventions Interventions: Adjust position;Support pillows;Position options;Expressed milk;Hand express;Breast massage;DEBP;Skin to skin;Assisted with latch;Breast feeding basics reviewed;Breast compression  Lactation Tools Discussed/Used     Consult Status Consult Status: Follow-up Date: 05/18/19 Follow-up type: In-patient    Danelle Earthly 05/17/2019, 3:45 AM

## 2019-05-17 NOTE — Progress Notes (Signed)
POSTPARTUM PROGRESS NOTE  Post Partum Day 2  Subjective:  Judy Ramsey is a 28 y.o. G1P1001 s/p SVD at [redacted]w[redacted]d.  She reports she is doing well. No acute events overnight. She denies any problems with ambulating, voiding or po intake. Denies nausea or vomiting.  Pain is well controlled.  Lochia is appropriate.  Objective: Blood pressure 114/68, pulse 98, temperature 98.7 F (37.1 C), temperature source Oral, resp. rate 18, height 5\' 6"  (1.676 m), weight 74 kg, last menstrual period 08/09/2018, SpO2 100 %, unknown if currently breastfeeding.  Physical Exam:  General: alert, cooperative and no distress Chest: no respiratory distress Heart:regular rate, distal pulses intact Abdomen: soft, nontender,  Uterine Fundus: firm, appropriately tender DVT Evaluation: No calf swelling or tenderness Extremities: no edema Skin: warm, dry  Recent Labs    05/14/19 2122  HGB 11.0*  HCT 32.9*    Assessment/Plan: Judy Ramsey is a 29 y.o. G1P1001 s/p SVD at [redacted]w[redacted]d   PPD#2 - Doing well. Continue routine postpartum care.  Contraception: condoms Feeding: Breast Dispo: Plan for discharge today.   LOS: 3 days    [redacted]w[redacted]d, DO, PGY-1 OBGYN Faculty Teaching Service  05/17/2019, 4:51 AM   I saw and evaluated the patient. I agree with the findings and the plan of care as documented in the resident's note.  05/19/2019, MD [facility] Family Medicine Fellow, [facility] for [facility]

## 2019-05-19 ENCOUNTER — Encounter: Payer: 59 | Admitting: Obstetrics and Gynecology

## 2019-05-19 ENCOUNTER — Telehealth: Payer: Self-pay | Admitting: *Deleted

## 2019-05-19 NOTE — Telephone Encounter (Signed)
Left voice message for patient to return nurse call. Spent patient a Wellsite geologist.  Clovis Pu, RN

## 2019-05-19 NOTE — Telephone Encounter (Signed)
-----   Message from Marti Sleigh, [place] sent at 05/19/2019 10:31 AM EST ----- Patient left message in regards to passing clots and has questions.  Want to know if you can call her back.

## 2019-06-22 ENCOUNTER — Ambulatory Visit (INDEPENDENT_AMBULATORY_CARE_PROVIDER_SITE_OTHER): Payer: 59 | Admitting: Advanced Practice Midwife

## 2019-06-22 ENCOUNTER — Other Ambulatory Visit: Payer: Self-pay

## 2019-06-22 ENCOUNTER — Encounter: Payer: Self-pay | Admitting: Advanced Practice Midwife

## 2019-06-22 DIAGNOSIS — Z1332 Encounter for screening for maternal depression: Secondary | ICD-10-CM | POA: Diagnosis not present

## 2019-06-22 NOTE — Progress Notes (Signed)
Appointment Date: 06/22/2019  OBGYN Clinic: [facility]  Chief Complaint:  Postpartum Visit  History of Present Illness: Judy Ramsey is a 29 y.o. Caucasian G1P1001 (No LMP recorded.), seen for the above chief complaint. Her past medical history is significant for none   She is s/p normal spontaneous vaginal delivery on 05/15/19 at 38.3 weeks; she was discharged to home on PPD#2. Pregnancy complicated by none. Baby is doing well.  Complains of: feeling some discomfort where the stitches were. She felt some burning when she tried to touch it. She also had a small piece of suture come out a few days ago.   Vaginal bleeding or discharge: No  Mode of feeding infant: Breast Intercourse: No  Contraception: no method PP depression s/s: No .   Any bowel or bladder issues: No  Pap smear: no abnormalities (date: 09/2018)  Review of Systems: Her 12 point review of systems is negative or as noted in the History of Present Illness.  Patient Active Problem List   Diagnosis Date Noted  . PROM (premature rupture of membranes) 05/17/2019  . Iron deficiency anemia during pregnancy 03/30/2019  . Supervision of normal first pregnancy, antepartum 03/29/2019  . Dysmenorrhea 12/06/2013    Medications Judy Ramsey had no medications administered during this visit. Current Outpatient Medications  Medication Sig Dispense Refill  . ferrous sulfate 325 (65 FE) MG tablet Take 325 mg by mouth daily with breakfast.    . Prenatal Vit-Fe Fumarate-FA (PRENATAL MULTIVITAMIN) TABS tablet Take 1 tablet by mouth daily at 12 noon.    Marland Kitchen acetaminophen (TYLENOL) 325 MG tablet Take 2 tablets (650 mg total) by mouth every 6 (six) hours as needed (for pain scale < 4). (Patient not taking: Reported on 06/22/2019) 30 tablet 0  . ibuprofen (ADVIL) 600 MG tablet Take 1 tablet (600 mg total) by mouth every 6 (six) hours. (Patient not taking: Reported on 06/22/2019) 30 tablet 0   No current facility-administered medications for this  visit.    Allergies Pumpkin flavor, Sulfa antibiotics, and Ultram [tramadol hcl]  Physical Exam:  Physical Exam  Constitutional: She is oriented to person, place, and time and well-developed, well-nourished, and in no distress. No distress.  HENT:  Head: Normocephalic.  Cardiovascular: Normal rate.  Pulmonary/Chest: Effort normal.  Genitourinary:    Genitourinary Comments: Small amount of suture noted at introitus. Otherwise well healed repair. Suture easily removed.    Neurological: She is alert and oriented to person, place, and time.  Skin: Skin is warm and dry.  Psychiatric: Affect normal.  Nursing note and vitals reviewed.    PP Depression Screening:   Edinburgh Postnatal Depression Scale - 06/22/19 1319      Edinburgh Postnatal Depression Scale:  In the Past 7 Days   I have been able to laugh and see the funny side of things.  0    I have looked forward with enjoyment to things.  0    I have blamed myself unnecessarily when things went wrong.  0    I have been anxious or worried for no good reason.  0    I have felt scared or panicky for no good reason.  0    Things have been getting on top of me.  0    I have been so unhappy that I have had difficulty sleeping.  0    I have felt sad or miserable.  0    I have been so unhappy that I have been crying.  1  The thought of harming myself has occurred to me.  0    Edinburgh Postnatal Depression Scale Total  1       Assessment:Patient is a 29 y.o. G1P1001 who is 6 weeks postpartum from a normal spontaneous vaginal delivery.  She is doing well.   Plan: 1. Postpartum care and examination  - DW patient that perineum will likely feel better soon. If no improvement in about 1-2 weeks message the office, and we can consider estrogen cream to help with healing.   RTC 1 year   Marcille Buffy DNP, CNM  06/22/19  1:50 PM  [facility]

## 2019-06-30 ENCOUNTER — Telehealth: Payer: 59 | Admitting: Obstetrics and Gynecology

## 2019-08-10 ENCOUNTER — Encounter: Payer: Self-pay | Admitting: General Practice

## 2020-07-26 ENCOUNTER — Ambulatory Visit: Payer: Self-pay | Admitting: Obstetrics and Gynecology

## 2020-09-27 ENCOUNTER — Telehealth: Payer: Self-pay

## 2020-09-27 NOTE — Telephone Encounter (Signed)
Called patient regarding no show fee, no answer, left voicemail with call back number.

## 2020-11-12 ENCOUNTER — Ambulatory Visit (INDEPENDENT_AMBULATORY_CARE_PROVIDER_SITE_OTHER): Payer: Self-pay | Admitting: *Deleted

## 2020-11-12 ENCOUNTER — Other Ambulatory Visit: Payer: Self-pay

## 2020-11-12 VITALS — BP 120/76 | HR 99 | Temp 98.0°F | Ht 66.0 in | Wt 120.0 lb

## 2020-11-12 DIAGNOSIS — Z3201 Encounter for pregnancy test, result positive: Secondary | ICD-10-CM

## 2020-11-12 DIAGNOSIS — Z3480 Encounter for supervision of other normal pregnancy, unspecified trimester: Secondary | ICD-10-CM | POA: Insufficient documentation

## 2020-11-12 LAB — POCT URINE PREGNANCY: Preg Test, Ur: POSITIVE — AB

## 2020-11-12 NOTE — Progress Notes (Signed)
   Location: [facility] Patient: clinic Provider: clinic  PRENATAL INTAKE SUMMARY  Ms. Eisenbeis presents today New OB Nurse Interview.  OB History     Gravida  2   Para  1   Term  1   Preterm      AB      Living  1      SAB      IAB      Ectopic      Multiple  0   Live Births  1          I have reviewed the patient's medical, obstetrical, social, and family histories, medications, and available lab results.  SUBJECTIVE She has no unusual complaints  OBJECTIVE Initial Nurse interview for history/labs (New OB)  EDD: 07/13/2021 by LMP GA: [redacted]w[redacted]d G2P1001 FHT: not assessed due to gestational age  GENERAL APPEARANCE: alert, well appearing, in no apparent distress, oriented to person, place and time   ASSESSMENT Positive urine UPT Normal pregnancy  PLAN Prenatal care:  [facility] Labs to be completed on 12/21/20 with Edd Arbour, CNM Continue PNV Has BP monitor and weight scale at home  Follow Up Instructions:   I discussed the assessment and treatment plan with the patient. The patient was provided an opportunity to ask questions and all were answered. The patient agreed with the plan and demonstrated an understanding of the instructions.   The patient was advised to call back or seek an in-person evaluation if the symptoms worsen or if the condition fails to improve as anticipated.  I provided 20 minutes of  face-to-face time during this encounter.  Clovis Pu, RN

## 2020-11-19 ENCOUNTER — Ambulatory Visit: Payer: 59

## 2020-12-12 IMAGING — US US RENAL
1 series · 15 of 25 positions shown · non-contrast
Comparison: None.

CLINICAL DATA: Initial evaluation for acute left flank pain.
Thirty-two weeks pregnant.

EXAM:
RENAL / URINARY TRACT ULTRASOUND COMPLETE

[Series 1: us renal · 15 of 42 slices shown]
[im 1/42]
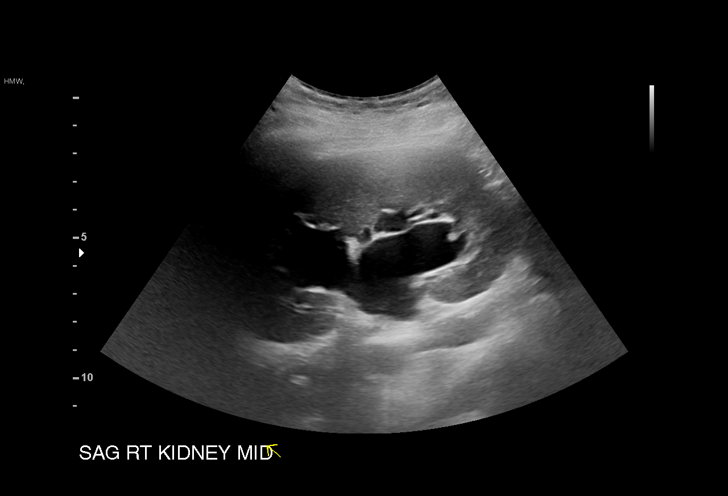
[im 4/42]
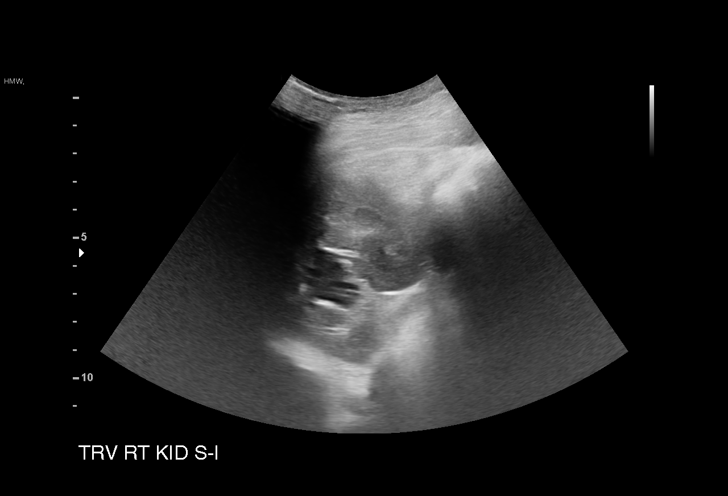
[im 7/42]
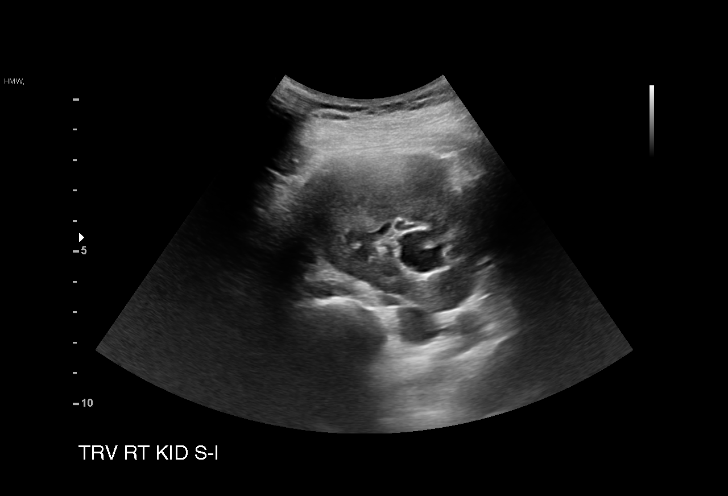
[im 9/42]
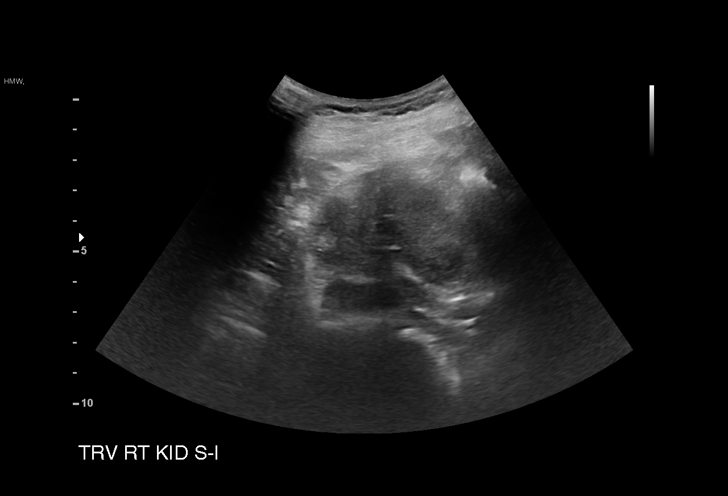
[im 12/42]
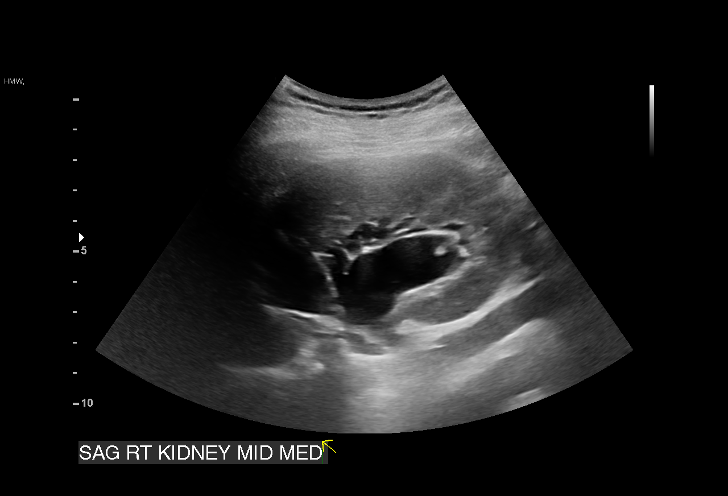
[im 16/42]
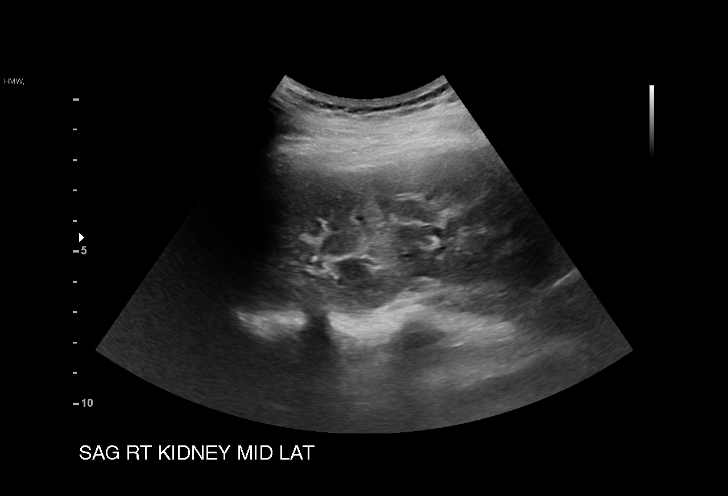
[im 18/42]
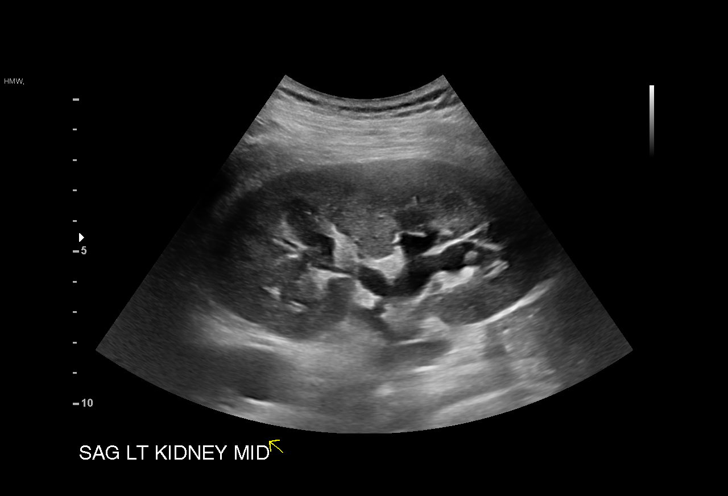
[im 21/42]
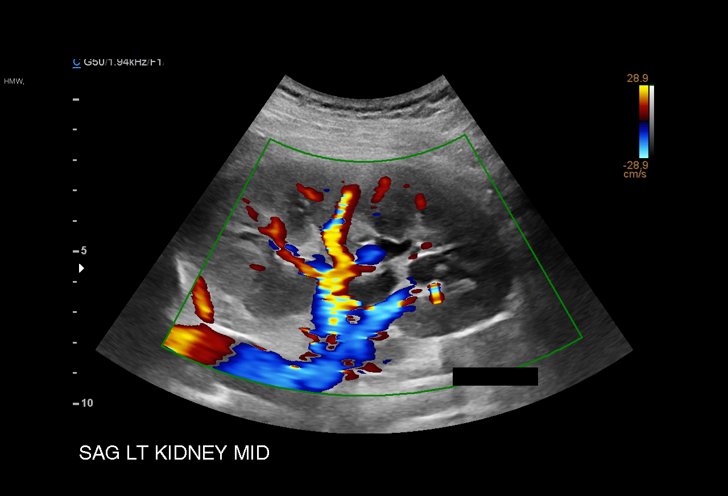
[im 24/42]
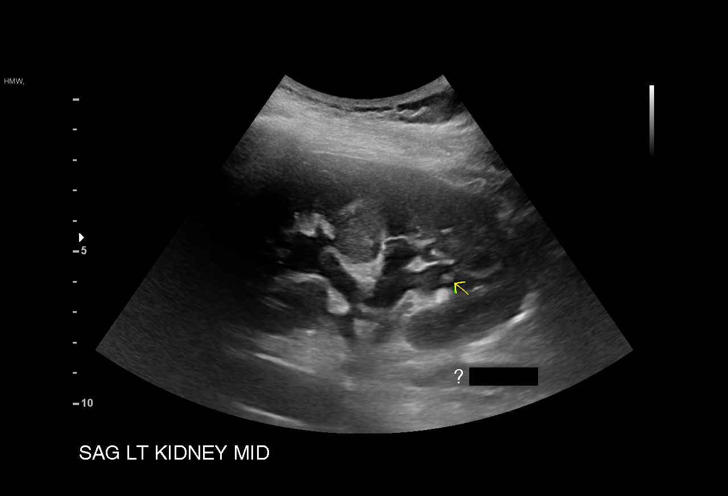
[im 26/42]
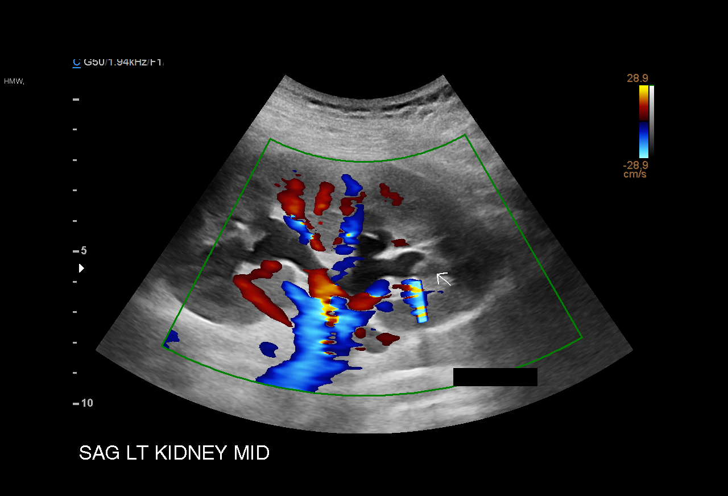
[im 30/42]
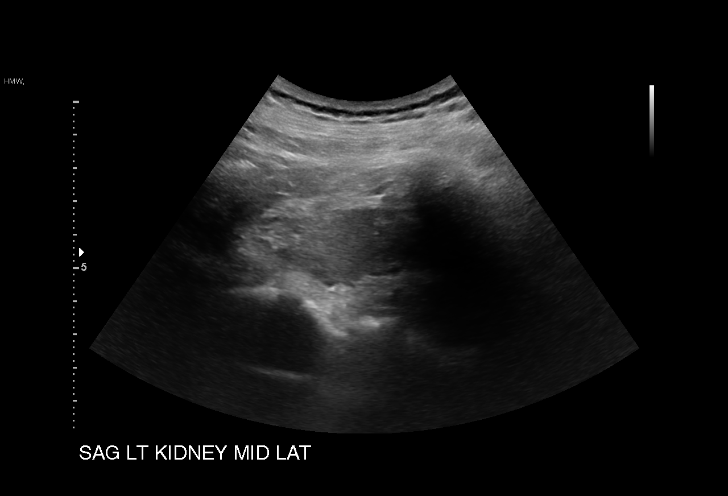
[im 33/42]
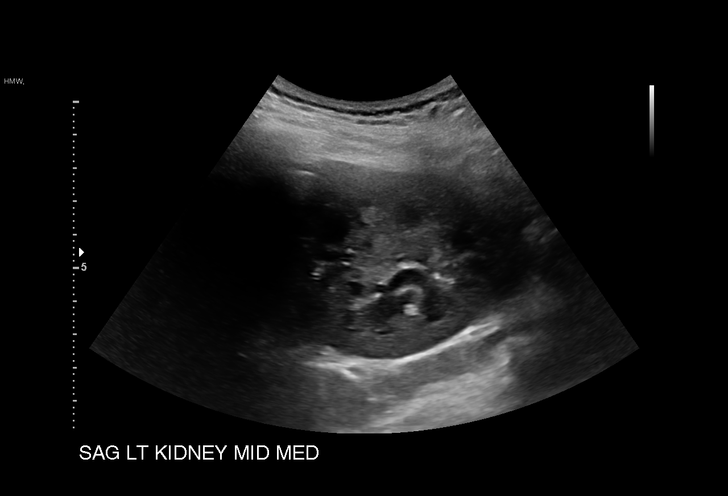
[im 35/42]
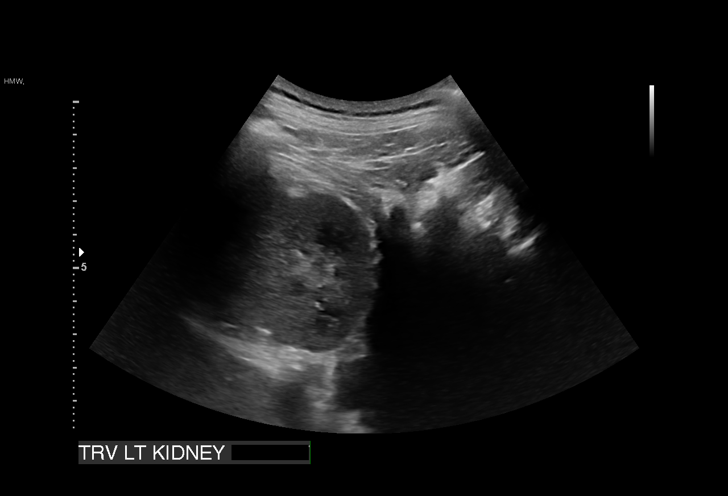
[im 38/42]
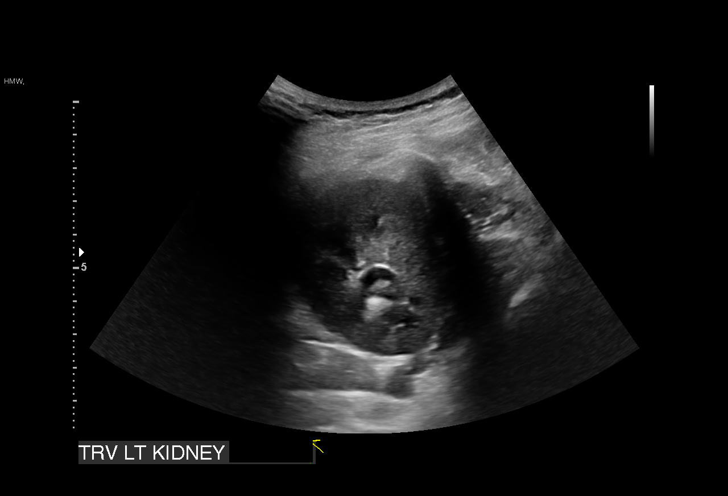
[im 42/42]
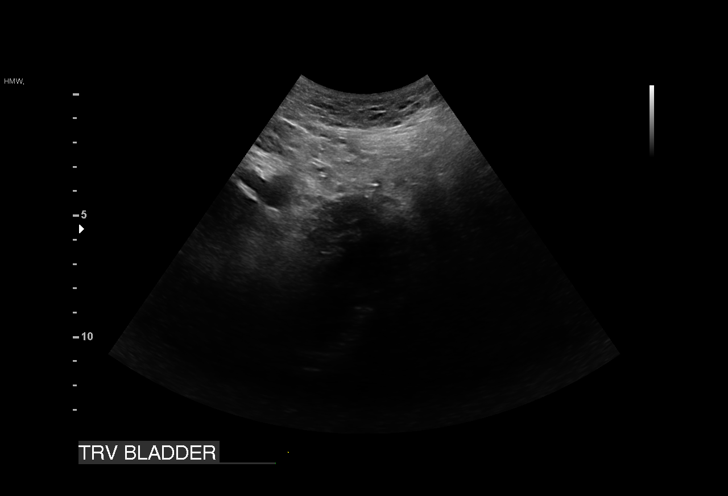

[15 of 25 positions shown; findings below may reference images not displayed]

FINDINGS: Right Kidney:

Renal measurements: 10.5 x 6.0 x 6.4 cm = volume: 210.8 mL.
Echogenicity within normal limits. Mild hydronephrosis. No visible
nephrolithiasis. No focal renal mass.

Left Kidney:

Renal measurements: 11.2 x 5.4 x 5.9 cm = volume: 187.7 mL.
Echogenicity within normal limits. Mild left hydronephrosis. 6 mm
nonobstructive stone present at the lower pole. No focal renal mass.

Bladder:

Not visualized.

Other:

None.
IMPRESSION: 1. Mild bilateral hydronephrosis, likely related to current
pregnancy.
2. 6 mm nonobstructive left renal nephrolithiasis.

## 2020-12-21 ENCOUNTER — Encounter: Payer: 59 | Admitting: Certified Nurse Midwife

## 2021-04-21 NOTE — L&D Delivery Note (Signed)
   Delivery Note:   G3P1011 at [redacted]w[redacted]d  Admitting diagnosis: Normal labor and delivery [O80] Risks: Postdates, iron deficiency anemia Onset of labor: 01/04/2022 at 1500 IOL/Augmentation: N/A ROM: 01/04/2022 at 1827, clear fluid  In waterbirth tub at 1825, temp 98.66F  Complete dilation at 01/04/2022  1827 Onset of pushing at [place] intrapartum:None  Pushing in hands and knees position in the waterbirth tub with CNM and L&D staff support. Husband, Quita Skye, and doula, Debe Coder, present for birth and supportive.  Delivery of a Live born female  Birth Weight:  pending APGAR: 9, 9   Newborn Delivery   Birth date/time: 01/04/2022 18:31:00 Delivery type: Vaginal, Spontaneous     in cephalic presentation, position OA to ROA.  APGAR:1 min-8 , 5 min-9   Nuchal Cord: No  Cord double clamped after cessation of pulsation, cut by Adam.  Collection of cord blood for typing completed. Arterial cord blood sample-No    Placenta delivered-Spontaneous  with 3 vessels . Uterotonics: None Placenta to patient per request Uterine tone firm  Bleeding scant  None  laceration identified.  Episiotomy:None  Local analgesia: N/A  Repair: N/A Est. Blood Loss (WC):58.52   Complications: None  Mom to postpartum. Baby Scottie to Couplet care / Skin to Skin.  Delivery Report:   Review the Delivery Report for details.    Suzan Nailer, CNM, MSN 01/04/2022, 7:16 PM

## 2021-08-11 ENCOUNTER — Encounter (HOSPITAL_BASED_OUTPATIENT_CLINIC_OR_DEPARTMENT_OTHER): Payer: Self-pay

## 2021-08-11 ENCOUNTER — Emergency Department (HOSPITAL_BASED_OUTPATIENT_CLINIC_OR_DEPARTMENT_OTHER)
Admission: EM | Admit: 2021-08-11 | Discharge: 2021-08-11 | Discharge status: Against Medical Advice | Payer: BC Managed Care – PPO | Attending: Emergency Medicine | Admitting: Emergency Medicine

## 2021-08-11 ENCOUNTER — Other Ambulatory Visit: Payer: Self-pay

## 2021-08-11 ENCOUNTER — Ambulatory Visit (HOSPITAL_COMMUNITY): Admit: 2021-08-11 | Discharge status: Absent without leave | Payer: Self-pay

## 2021-08-11 DIAGNOSIS — Z20822 Contact with and (suspected) exposure to covid-19: Secondary | ICD-10-CM | POA: Insufficient documentation

## 2021-08-11 DIAGNOSIS — J069 Acute upper respiratory infection, unspecified: Secondary | ICD-10-CM | POA: Diagnosis not present

## 2021-08-11 DIAGNOSIS — R509 Fever, unspecified: Secondary | ICD-10-CM | POA: Diagnosis present

## 2021-08-11 LAB — RESP PANEL BY RT-PCR (FLU A&B, COVID) ARPGX2
Influenza A by PCR: NEGATIVE
Influenza B by PCR: NEGATIVE
SARS Coronavirus 2 by RT PCR: NEGATIVE

## 2021-08-11 LAB — GROUP A STREP BY PCR: Group A Strep by PCR: NOT DETECTED

## 2021-08-11 NOTE — ED Notes (Signed)
Pt stated she is ready to go and will watch for results. Pt updated on strept test results. Says she feels better and will follow up with midwife tomorrow.  ?

## 2021-08-11 NOTE — ED Notes (Signed)
Pt noted to have nose bleed following nasopharyngeal swab. Pt provided with tissue and asked to hold pressure. Will continue to monitor.  ?

## 2021-08-11 NOTE — ED Provider Notes (Signed)
?[facility] EMERGENCY DEPT ?Provider Note ? ? ?CSN: [id number] ?Arrival date & time: 08/11/21  1758 ? ?  ? ?History ? ?Chief Complaint  ?Patient presents with  ? Fever  ? ? ?Reizy Dunlow is a 31 y.o. female. ? ?Patient is a 31 year old female who presents with runny nose congestion and body aches.  She says her symptoms started about 4 days ago.  She initially had a fever of 101 when it started.  She had felt better the next couple days and did not have a fever.  Today she feels a lot of congestion in her sinuses.  She also has a sore throat.  She has a nonproductive cough.  She had a subjective fever.  No nausea or vomiting.  No chest pain.  No shortness of breath.  She is [redacted] weeks pregnant.  She does not have any abdominal pain.  No leakage of fluid or bleeding.  She does not really feel many fetal movements at baseline. ? ? ?  ? ?Home Medications ?Prior to Admission medications   ?Medication Sig Start Date End Date Taking? Authorizing Provider  ?acetaminophen (TYLENOL) 325 MG tablet Take 2 tablets (650 mg total) by mouth every 6 (six) hours as needed (for pain scale < 4). ?Patient not taking: Reported on 06/22/2019 05/17/19   Joselyn Arrow, MD  ?ferrous sulfate 325 (65 FE) MG tablet Take 325 mg by mouth daily with breakfast.    [provider]  ?ibuprofen (ADVIL) 600 MG tablet Take 1 tablet (600 mg total) by mouth every 6 (six) hours. ?Patient not taking: Reported on 06/22/2019 05/17/19   Joselyn Arrow, MD  ?Prenatal Vit-Fe Fumarate-FA (PRENATAL MULTIVITAMIN) TABS tablet Take 1 tablet by mouth daily at 12 noon.    [provider]  ?   ? ?Allergies    ?Pumpkin flavor, Sulfa antibiotics, and Ultram [tramadol hcl]   ? ?Review of Systems   ?Review of Systems  ?Constitutional:  Positive for fatigue and fever. Negative for chills and diaphoresis.  ?HENT:  Positive for congestion, postnasal drip, rhinorrhea and sore throat. Negative for sneezing, trouble swallowing and voice change.    ?Eyes: Negative.   ?Respiratory:  Positive for cough. Negative for chest tightness and shortness of breath.   ?Cardiovascular:  Negative for chest pain and leg swelling.  ?Gastrointestinal:  Negative for abdominal pain, blood in stool, diarrhea, nausea and vomiting.  ?Genitourinary:  Negative for difficulty urinating, flank pain, frequency and hematuria.  ?Musculoskeletal:  Negative for arthralgias and back pain.  ?Skin:  Negative for rash.  ?Neurological:  Positive for headaches. Negative for dizziness, speech difficulty, weakness and numbness.  ? ?Physical Exam ?Updated Vital Signs ?BP 118/77   Pulse 92   Temp 98.7 ?F (37.1 ?C)   Resp 18   Ht 5\' 6"  (1.676 m)   Wt 62.6 kg   LMP 10/06/2020   SpO2 100%   Breastfeeding No   BMI 22.27 kg/m?  ?Physical Exam ?Constitutional:   ?   Appearance: She is well-developed.  ?HENT:  ?   Head: Normocephalic and atraumatic.  ?   Right Ear: Tympanic membrane normal.  ?   Left Ear: Tympanic membrane normal.  ?   Nose: Congestion present.  ?   Mouth/Throat:  ?   Pharynx: No oropharyngeal exudate or posterior oropharyngeal erythema.  ?   Comments: Uvula is midline, no trismus ?Eyes:  ?   Pupils: Pupils are equal, round, and reactive to light.  ?Neck:  ?  Comments: No meningismus ?Cardiovascular:  ?   Rate and Rhythm: Normal rate and regular rhythm.  ?   Heart sounds: Normal heart sounds.  ?Pulmonary:  ?   Effort: Pulmonary effort is normal. No respiratory distress.  ?   Breath sounds: Normal breath sounds. No wheezing or rales.  ?Chest:  ?   Chest wall: No tenderness.  ?Abdominal:  ?   General: Bowel sounds are normal.  ?   Palpations: Abdomen is soft.  ?   Tenderness: There is no abdominal tenderness. There is no guarding or rebound.  ?Musculoskeletal:     ?   General: Normal range of motion.  ?   Cervical back: Normal range of motion and neck supple.  ?Lymphadenopathy:  ?   Cervical: No cervical adenopathy.  ?Skin: ?   General: Skin is warm and dry.  ?   Findings: No rash.   ?Neurological:  ?   Mental Status: She is alert and oriented to person, place, and time.  ? ? ?ED Results / Procedures / Treatments   ?Labs ?(all labs ordered are listed, but only abnormal results are displayed) ?Labs Reviewed  ?RESP PANEL BY RT-PCR (FLU A&B, COVID) [id number]  ?GROUP A STREP BY PCR  ? ? ?EKG ?None ? ?Radiology ?No results found. ? ?Procedures ?Procedures  ? ? ?Medications Ordered in ED ?Medications - No data to display ? ?ED Course/ Medical Decision Making/ A&P ?  ?                        ?Medical Decision Making ? ?Patient is a 31 year old who presents with URI symptoms.  Her throat exam is benign.  Her lungs are clear.  I do not have any clinical suspicion of pneumonia.  She has no hypoxia, productive cough, shortness of breath or other symptoms to suggest pneumonia.  Given her other symptoms, her illness is likely viral in nature.  She is well-appearing.  There is no meningeal symptoms.  Her strep test is negative.  She actually eloped from the ED prior to her other labs resulting but on checking her labs following this, her COVID/flu test are negative.  She previously was instructed in symptomatic care and medications that were safe to use in pregnancy.  No further instructions could be given as patient eloped prior to being discharged. ?Final Clinical Impression(s) / ED Diagnoses ?Final diagnoses:  ?Viral URI  ? ? ?Rx / DC Orders ?ED Discharge Orders   ? ? None  ? ?  ? ? ?  ?Rolan Bucco, MD ?08/11/21 2335 ? ?

## 2021-08-11 NOTE — ED Triage Notes (Signed)
Pt c/o fever, chills, sore throat, HA, body aches, and dry cough x 4 days. Pt is [redacted] weeks pregnant. Pt reports a negative home COVID test.  ?

## 2021-08-14 ENCOUNTER — Ambulatory Visit
Admission: EM | Admit: 2021-08-14 | Discharge: 2021-08-14 | Disposition: A | Discharge status: Home / Self Care | Payer: Self-pay | Attending: Internal Medicine | Admitting: Internal Medicine

## 2021-08-14 DIAGNOSIS — R051 Acute cough: Secondary | ICD-10-CM

## 2021-08-14 DIAGNOSIS — J069 Acute upper respiratory infection, unspecified: Secondary | ICD-10-CM

## 2021-08-14 MED ORDER — AMOXICILLIN 875 MG PO TABS: 875.0000 mg | ORAL_TABLET | Freq: Two times a day (BID) | ORAL | 0 refills | Status: AC

## 2021-08-14 NOTE — Discharge Instructions (Addendum)
You have been prescribed an antibiotic for upper respiratory infection.  Please ensure that you are getting adequate fluid hydration.  Please get a pulse oximeter from [facility], or [facility] and monitor heart rate.  Please follow-up at hospital or OB/GYN if heart rate remains elevated.  Normal heart rate is 60-100. ?

## 2021-08-14 NOTE — ED Provider Notes (Signed)
?[facility] ? ? ? ?CSN: [id number] ?Arrival date & time: 08/14/21  1556 ? ? ?  ? ?History   ?Chief Complaint ?Chief Complaint  ?Patient presents with  ? Nasal Congestion  ? ? ?HPI ?Judy Ramsey is a 31 y.o. female.  ? ?Patient presents with nasal congestion, cough, sore throat that has been present for approximately 7 to 8 days.  She presented to the emergency department when symptoms first started and had a negative for strep, COVID, flu test.  She was diagnosed with viral upper respiratory infection.  Patient has been taking Mucinex and Tylenol with no improvement.  She reports that her nasal congestion and sinus pressure has worsened.  She is having a lot of sinus pressure in the right cheek as well as dental pain due to sinus pressure.  Tmax at home was 101 when symptoms first started.  Last known fever was a few days prior which was 99.  Denies chest pain, shortness of breath, ear pain, nausea, vomiting, diarrhea, abdominal pain.  Patient is currently [redacted] weeks pregnant. ? ? ? ?Past Medical History:  ?Diagnosis Date  ? Dysmenorrhea   ? Head injury   ? Iron deficiency anemia during pregnancy 03/30/2019  ? Taking OTC iron supplementation daily   ? Kidney stone complicating pregnancy   ? PROM (premature rupture of membranes) 05/17/2019  ? Supervision of normal first pregnancy, antepartum 03/29/2019  ?  Nursing Staff Provider  Office Location  [facility] Dating  U/S  Language  English Anatomy US  02/04/2019 - normal   Flu Vaccine  Declined 03/30/2019 Genetic Screen  declined   TDaP vaccine   Declined 03/30/2019 Hgb A1C or  GTT Third trimester 02/28/2019-61  Rhogam   n/a   LAB RESULTS   Feeding Plan Breast Blood Type O/--/-- (08/17 0000)   Contraception None Antibody Positive (08/17 0000)  Circumci  ? ? ?Patient Active Problem List  ? Diagnosis Date Noted  ? Supervision of other normal pregnancy, antepartum 11/12/2020  ? Dysmenorrhea 12/06/2013  ? ? ?Past Surgical History:  ?Procedure Laterality Date  ?  BUNIONECTOMY  2018  ? right foot  ? ? ?OB History   ? ? Gravida  ?2  ? Para  ?1  ? Term  ?1  ? Preterm  ?   ? AB  ?   ? Living  ?1  ?  ? ? SAB  ?   ? IAB  ?   ? Ectopic  ?   ? Multiple  ?0  ? Live Births  ?1  ?   ?  ?  ? ? ? ?Home Medications   ? ?Prior to Admission medications   ?Medication Sig Start Date End Date Taking? Authorizing Provider  ?amoxicillin (AMOXIL) 875 MG tablet Take 1 tablet (875 mg total) by mouth 2 (two) times daily for 7 days. 08/14/21 08/21/21 Yes Teodora Medici, FNP  ?acetaminophen (TYLENOL) 325 MG tablet Take 2 tablets (650 mg total) by mouth every 6 (six) hours as needed (for pain scale < 4). ?Patient not taking: Reported on 06/22/2019 05/17/19   Chauncey Mann, MD  ?ferrous sulfate 325 (65 FE) MG tablet Take 325 mg by mouth daily with breakfast.    [provider]  ?ibuprofen (ADVIL) 600 MG tablet Take 1 tablet (600 mg total) by mouth every 6 (six) hours. ?Patient not taking: Reported on 06/22/2019 05/17/19   Chauncey Mann, MD  ?Prenatal Vit-Fe Fumarate-FA (PRENATAL MULTIVITAMIN) TABS tablet Take 1 tablet by mouth  daily at 12 noon.    [provider]  ? ? ?Family History ?Family History  ?Problem Relation Age of Onset  ? Hypertension Paternal Grandmother   ? Hypertension Mother   ? ? ?Social History ?Social History  ? ?Tobacco Use  ? Smoking status: Never  ? Smokeless tobacco: Never  ?Vaping Use  ? Vaping Use: Never used  ?Substance Use Topics  ? Alcohol use: No  ? Drug use: No  ? ? ? ?Allergies   ?Pumpkin flavor, Sulfa antibiotics, and Ultram [tramadol hcl] ? ? ?Review of Systems ?Review of Systems ?Per HPI ? ?Physical Exam ?Triage Vital Signs ?ED Triage Vitals  ?Enc Vitals Group  ?   BP 08/14/21 1606 110/77  ?   Pulse Rate 08/14/21 1606 (!) 129  ?   Resp 08/14/21 1606 18  ?   Temp 08/14/21 1606 98.4 ?F (36.9 ?C)  ?   Temp Source 08/14/21 1606 Oral  ?   SpO2 08/14/21 1606 100 %  ?   Weight --   ?   Height --   ?   Head Circumference --   ?   Peak Flow --   ?   Pain Score  08/14/21 1607 0  ?   Pain Loc --   ?   Pain Edu? --   ?   Excl. in [place]? --   ? ?No data found. ? ?Updated Vital Signs ?BP 110/77 (BP Location: Left Arm)   Pulse (!) 129   Temp 98.4 ?F (36.9 ?C) (Oral)   Resp 18   LMP 10/06/2020   SpO2 100%  ? ?Visual Acuity ?Right Eye Distance:   ?Left Eye Distance:   ?Bilateral Distance:   ? ?Right Eye Near:   ?Left Eye Near:    ?Bilateral Near:    ? ?Physical Exam ?Constitutional:   ?   General: She is not in acute distress. ?   Appearance: Normal appearance. She is not toxic-appearing or diaphoretic.  ?HENT:  ?   Head: Normocephalic and atraumatic.  ?   Right Ear: Tympanic membrane and ear canal normal.  ?   Left Ear: Tympanic membrane and ear canal normal.  ?   Nose: Congestion present.  ?   Right Sinus: Maxillary sinus tenderness present.  ?   Left Sinus: Maxillary sinus tenderness present.  ?   Mouth/Throat:  ?   Mouth: Mucous membranes are moist.  ?   Pharynx: No posterior oropharyngeal erythema.  ?Eyes:  ?   Extraocular Movements: Extraocular movements intact.  ?   Conjunctiva/sclera: Conjunctivae normal.  ?   Pupils: Pupils are equal, round, and reactive to light.  ?Cardiovascular:  ?   Rate and Rhythm: Normal rate and regular rhythm.  ?   Pulses: Normal pulses.  ?   Heart sounds: Normal heart sounds.  ?Pulmonary:  ?   Effort: Pulmonary effort is normal. No respiratory distress.  ?   Breath sounds: Normal breath sounds. No stridor. No wheezing, rhonchi or rales.  ?Abdominal:  ?   General: Abdomen is flat. Bowel sounds are normal.  ?   Palpations: Abdomen is soft.  ?Musculoskeletal:     ?   General: Normal range of motion.  ?   Cervical back: Normal range of motion.  ?Skin: ?   General: Skin is warm and dry.  ?Neurological:  ?   General: No focal deficit present.  ?   Mental Status: She is alert and oriented to person, place, and time. Mental status  is at baseline.  ?Psychiatric:     ?   Mood and Affect: Mood normal.     ?   Behavior: Behavior normal.  ? ? ? ?UC  Treatments / Results  ?Labs ?(all labs ordered are listed, but only abnormal results are displayed) ?Labs Reviewed - No data to display ? ?EKG ? ? ?Radiology ?No results found. ? ?Procedures ?Procedures (including critical care time) ? ?Medications Ordered in UC ?Medications - No data to display ? ?Initial Impression / Assessment and Plan / UC Course  ?I have reviewed the triage vital signs and the nursing notes. ? ?Pertinent labs & imaging results that were available during my care of the patient were reviewed by me and considered in my medical decision making (see chart for details). ? ?  ? ?Concern for sinus infection given worsening symptoms and maxillary facial tenderness.  Will opt to treat with amoxicillin antibiotic.  No additional viral testing or strep testing necessary as it was negative at previous ED visit.  Patient has elevated heart rate with recheck of the same at urgent care today.  Suspect acute illness is related to elevated heart rate.  Patient advised to ensure adequate fluid hydration and to monitor with pulse oximetry at home.  Advised patient to follow-up at ED or OB/GYN if heart rate remains elevated.  No concern for need for EKG or emergent evaluation in the ED at this time.  No concern for pneumonia given no adventitious lung sounds on exam.  Discussed return precautions.  Patient verbalized understanding and was agreeable with plan. ?Final Clinical Impressions(s) / UC Diagnoses  ? ?Final diagnoses:  ?Acute upper respiratory infection  ?Acute cough  ? ? ? ?Discharge Instructions   ? ?  ?You have been prescribed an antibiotic for upper respiratory infection.  Please ensure that you are getting adequate fluid hydration.  Please get a pulse oximeter from [facility], or [facility] and monitor heart rate.  Please follow-up at hospital or OB/GYN if heart rate remains elevated.  Normal heart rate is 60-100. ? ? ? ?ED Prescriptions   ? ? Medication Sig Dispense Auth. Provider  ? amoxicillin  (AMOXIL) 875 MG tablet Take 1 tablet (875 mg total) by mouth 2 (two) times daily for 7 days. 14 tablet Oswaldo Conroy E, [place]  ? ?  ? ?PDMP not reviewed this encounter. ?  ?Teodora Medici, Ewa Gentry ?08/14/21 1659 ? ?

## 2021-08-14 NOTE — ED Triage Notes (Signed)
Pt c/o cough, sore throat, fever Sunday, nasal congestion. Has been recurring for over a week.  ? ?Resolved sxs include body aches and chills, headache.  ? ?States was recently in ED and tested (-) for covid flu strep. Concerned for sinus infection.  ? ?Currently pregnant. Tried mucinex and tylenol.  ?

## 2021-11-06 ENCOUNTER — Other Ambulatory Visit: Payer: Self-pay

## 2021-11-06 DIAGNOSIS — O99013 Anemia complicating pregnancy, third trimester: Secondary | ICD-10-CM

## 2021-11-07 ENCOUNTER — Encounter (HOSPITAL_COMMUNITY): Payer: BC Managed Care – PPO

## 2021-11-15 ENCOUNTER — Encounter (HOSPITAL_COMMUNITY): Payer: BC Managed Care – PPO

## 2021-11-18 ENCOUNTER — Encounter (HOSPITAL_COMMUNITY): Payer: BC Managed Care – PPO

## 2021-12-25 ENCOUNTER — Inpatient Hospital Stay (HOSPITAL_COMMUNITY): Admit: 2021-12-25 | Payer: Self-pay

## 2022-01-04 ENCOUNTER — Inpatient Hospital Stay (HOSPITAL_COMMUNITY)
Admission: AD | Admit: 2022-01-04 | Discharge: 2022-01-06 | DRG: 806 | Disposition: A | Discharge status: Home / Self Care | Payer: BC Managed Care – PPO | Attending: Obstetrics and Gynecology | Admitting: Obstetrics and Gynecology

## 2022-01-04 ENCOUNTER — Other Ambulatory Visit: Payer: Self-pay

## 2022-01-04 ENCOUNTER — Encounter (HOSPITAL_COMMUNITY): Payer: Self-pay

## 2022-01-04 DIAGNOSIS — O9A22 Injury, poisoning and certain other consequences of external causes complicating childbirth: Secondary | ICD-10-CM | POA: Diagnosis not present

## 2022-01-04 DIAGNOSIS — D62 Acute posthemorrhagic anemia: Secondary | ICD-10-CM | POA: Diagnosis not present

## 2022-01-04 DIAGNOSIS — R55 Syncope and collapse: Secondary | ICD-10-CM | POA: Diagnosis not present

## 2022-01-04 DIAGNOSIS — O99019 Anemia complicating pregnancy, unspecified trimester: Secondary | ICD-10-CM | POA: Diagnosis present

## 2022-01-04 DIAGNOSIS — Z3A41 41 weeks gestation of pregnancy: Secondary | ICD-10-CM

## 2022-01-04 DIAGNOSIS — S0990XA Unspecified injury of head, initial encounter: Secondary | ICD-10-CM | POA: Diagnosis not present

## 2022-01-04 DIAGNOSIS — O99893 Other specified diseases and conditions complicating puerperium: Secondary | ICD-10-CM | POA: Diagnosis not present

## 2022-01-04 DIAGNOSIS — O9081 Anemia of the puerperium: Secondary | ICD-10-CM | POA: Diagnosis not present

## 2022-01-04 DIAGNOSIS — W1811XA Fall from or off toilet without subsequent striking against object, initial encounter: Secondary | ICD-10-CM | POA: Diagnosis not present

## 2022-01-04 DIAGNOSIS — D509 Iron deficiency anemia, unspecified: Secondary | ICD-10-CM | POA: Diagnosis present

## 2022-01-04 DIAGNOSIS — O480 Post-term pregnancy: Secondary | ICD-10-CM | POA: Diagnosis present

## 2022-01-04 DIAGNOSIS — Y92231 Patient bathroom in hospital as the place of occurrence of the external cause: Secondary | ICD-10-CM | POA: Diagnosis not present

## 2022-01-04 LAB — CBC
HCT: 28.6 % — ABNORMAL LOW (ref 36.0–46.0)
HCT: 25.5 % — ABNORMAL LOW (ref 36.0–46.0)
Hemoglobin: 9.5 g/dL — ABNORMAL LOW (ref 12.0–15.0)
Hemoglobin: 8.8 g/dL — ABNORMAL LOW (ref 12.0–15.0)
MCH: 28.7 pg (ref 26.0–34.0)
MCH: 29.5 pg (ref 26.0–34.0)
MCHC: 33.2 g/dL (ref 30.0–36.0)
MCHC: 34.5 g/dL (ref 30.0–36.0)
MCV: 86.4 fL (ref 80.0–100.0)
MCV: 85.6 fL (ref 80.0–100.0)
Platelets: 266 10*3/uL (ref 150–400)
Platelets: 209 10*3/uL (ref 150–400)
RBC: 3.31 MIL/uL — ABNORMAL LOW (ref 3.87–5.11)
RBC: 2.98 MIL/uL — ABNORMAL LOW (ref 3.87–5.11)
RDW: 13.2 % (ref 11.5–15.5)
RDW: 13.2 % (ref 11.5–15.5)
WBC: 19.9 10*3/uL — ABNORMAL HIGH (ref 4.0–10.5)
WBC: 16.8 10*3/uL — ABNORMAL HIGH (ref 4.0–10.5)
nRBC: 0 % (ref 0.0–0.2)
nRBC: 0 % (ref 0.0–0.2)

## 2022-01-04 MED ORDER — AMMONIA AROMATIC IN INHA
RESPIRATORY_TRACT | Status: AC
  Filled 2022-01-04: qty 10

## 2022-01-04 MED ORDER — FLEET ENEMA 7-19 GM/118ML RE ENEM: 1.0000 | ENEMA | RECTAL | Status: DC | PRN

## 2022-01-04 MED ORDER — ONDANSETRON HCL 4 MG/2ML IJ SOLN: 4.0000 mg | INTRAMUSCULAR | Status: DC | PRN

## 2022-01-04 MED ORDER — DIBUCAINE (PERIANAL) 1 % EX OINT: 1.0000 | TOPICAL_OINTMENT | CUTANEOUS | Status: DC | PRN

## 2022-01-04 MED ORDER — COCONUT OIL OIL: 1.0000 | TOPICAL_OIL | Status: DC | PRN

## 2022-01-04 MED ORDER — SIMETHICONE 80 MG PO CHEW: 80.0000 mg | CHEWABLE_TABLET | ORAL | Status: DC | PRN

## 2022-01-04 MED ORDER — ONDANSETRON HCL 4 MG/2ML IJ SOLN: 4.0000 mg | Freq: Four times a day (QID) | INTRAMUSCULAR | Status: DC | PRN

## 2022-01-04 MED ORDER — LACTATED RINGERS IV BOLUS
500.0000 mL | Freq: Once | INTRAVENOUS | Status: AC
  Administered 2022-01-04: 500 mL via INTRAVENOUS

## 2022-01-04 MED ORDER — ONDANSETRON HCL 4 MG PO TABS: 4.0000 mg | ORAL_TABLET | ORAL | Status: DC | PRN

## 2022-01-04 MED ORDER — OXYTOCIN-SODIUM CHLORIDE 30-0.9 UT/500ML-% IV SOLN: 2.5000 [IU]/h | INTRAVENOUS | Status: DC

## 2022-01-04 MED ORDER — SOD CITRATE-CITRIC ACID 500-334 MG/5ML PO SOLN: 30.0000 mL | ORAL | Status: DC | PRN

## 2022-01-04 MED ORDER — ACETAMINOPHEN 325 MG PO TABS
650.0000 mg | ORAL_TABLET | ORAL | Status: DC | PRN
  Administered 2022-01-05 (×2): 650 mg via ORAL
  Filled 2022-01-04 (×2): qty 2

## 2022-01-04 MED ORDER — ZOLPIDEM TARTRATE 5 MG PO TABS: 5.0000 mg | ORAL_TABLET | Freq: Every evening | ORAL | Status: DC | PRN

## 2022-01-04 MED ORDER — LACTATED RINGERS IV SOLN: 500.0000 mL | INTRAVENOUS | Status: DC | PRN

## 2022-01-04 MED ORDER — BENZOCAINE-MENTHOL 20-0.5 % EX AERO: 1.0000 | INHALATION_SPRAY | CUTANEOUS | Status: DC | PRN

## 2022-01-04 MED ORDER — OXYCODONE-ACETAMINOPHEN 5-325 MG PO TABS: 2.0000 | ORAL_TABLET | ORAL | Status: DC | PRN

## 2022-01-04 MED ORDER — METHYLERGONOVINE MALEATE 0.2 MG/ML IJ SOLN: 0.2000 mg | INTRAMUSCULAR | Status: AC | PRN

## 2022-01-04 MED ORDER — DIPHENHYDRAMINE HCL 25 MG PO CAPS: 25.0000 mg | ORAL_CAPSULE | Freq: Four times a day (QID) | ORAL | Status: DC | PRN

## 2022-01-04 MED ORDER — TETANUS-DIPHTH-ACELL PERTUSSIS 5-2.5-18.5 LF-MCG/0.5 IM SUSY: 0.5000 mL | PREFILLED_SYRINGE | Freq: Once | INTRAMUSCULAR | Status: DC

## 2022-01-04 MED ORDER — IBUPROFEN 600 MG PO TABS: 600.0000 mg | ORAL_TABLET | Freq: Four times a day (QID) | ORAL | Status: DC

## 2022-01-04 MED ORDER — OXYCODONE-ACETAMINOPHEN 5-325 MG PO TABS: 1.0000 | ORAL_TABLET | ORAL | Status: DC | PRN

## 2022-01-04 MED ORDER — LACTATED RINGERS IV SOLN: INTRAVENOUS | Status: DC

## 2022-01-04 MED ORDER — ACETAMINOPHEN 325 MG PO TABS: 650.0000 mg | ORAL_TABLET | ORAL | Status: DC | PRN

## 2022-01-04 MED ORDER — TRANEXAMIC ACID-NACL 1000-0.7 MG/100ML-% IV SOLN
1000.0000 mg | INTRAVENOUS | Status: AC
  Administered 2022-01-04: 1000 mg via INTRAVENOUS
  Filled 2022-01-04: qty 100

## 2022-01-04 MED ORDER — SENNOSIDES-DOCUSATE SODIUM 8.6-50 MG PO TABS
2.0000 | ORAL_TABLET | Freq: Every day | ORAL | Status: DC
  Administered 2022-01-05: 2 via ORAL
  Filled 2022-01-04: qty 2

## 2022-01-04 MED ORDER — OXYTOCIN BOLUS FROM INFUSION: 333.0000 mL | Freq: Once | INTRAVENOUS | Status: DC

## 2022-01-04 MED ORDER — WITCH HAZEL-GLYCERIN EX PADS: 1.0000 | MEDICATED_PAD | CUTANEOUS | Status: DC | PRN

## 2022-01-04 MED ORDER — METHYLERGONOVINE MALEATE 0.2 MG PO TABS
0.2000 mg | ORAL_TABLET | ORAL | Status: AC | PRN
  Administered 2022-01-04 – 2022-01-05 (×6): 0.2 mg via ORAL
  Filled 2022-01-04 (×6): qty 1

## 2022-01-04 MED ORDER — PRENATAL MULTIVITAMIN CH
1.0000 | ORAL_TABLET | Freq: Every day | ORAL | Status: DC
  Administered 2022-01-05: 1 via ORAL
  Filled 2022-01-04: qty 1

## 2022-01-04 MED ORDER — LIDOCAINE HCL (PF) 1 % IJ SOLN: 30.0000 mL | INTRAMUSCULAR | Status: DC | PRN

## 2022-01-04 NOTE — Progress Notes (Addendum)
Triton EBL by nursing staff 2278cc after weighing pad and a sheet from bed. Fluid on pad and sheet not frank red bleeding and extra fluid appreciated from water from tub and patient being dried on the bed. CNM total EBL approximately 1500cc.   Hgb after initial blood loss 9.5. Will repeat CBC at 0500. Continue IVF through the night and PO Methergine series for 24 hours. S/P TXA.   Patient stable and denies dizziness or pain. VS stable.   Dr. Murrell Redden updated when she arrived at the hospital for assessment of patient.   Suzan Nailer, MSN, CNM 01/04/2022, 10:22 PM

## 2022-01-04 NOTE — Progress Notes (Signed)
Initial blood loss at delivery was scant. Patient declined IM Pitocin. Per RN, on second fundal check, significant clots were expressed and Triton calculated 1080cc additional blood loss. Recommended IM Pitocin and patient declines. Known iron deficiency anemia. On third fundal rub, additional small clot was expressed. IM Pitocin was again recommended and patient declined. Education given regarding PPH and possible need for blood transfusion. Will monitor VS closely and check CBC in AM.   Suzan Nailer, MSN, CNM 01/04/2022, 7:39 PM

## 2022-01-04 NOTE — Progress Notes (Addendum)
Called by nurse for patient with syncopal episode on toilet. States patient was on the toilet doing peri care and fell off the side of the toilet. CNM entered the bathroom and patient was awake with nurse and husband by her side. Patient states she feels dizzy. Recommended placement of IV and CBC and patients consents. Patient lifted from the bathroom floor by husband and nursing staff and taken to bed. IV placed. Discussed Pitocin infusion and patient consents if bleeding continues. Will monitor VS. Dr. Murrell Redden updated on patient status and plan of care.   Suzan Nailer, MSN, CNM 01/04/2022, 8:53 PM

## 2022-01-04 NOTE — Progress Notes (Signed)
Called by CNM to inform of pt PPH and that pt refusing pitocin, bleeding had improved and ebl about 1532ml; recommended txa, best for pitocin but if pt refuses pitocin, begin cytotec, methergine now IM and plan methergine po for total 24 hr; would plan frequent fundal massage to maintain clear uterus; also discussed frequent bladder emptying  CNM then called back - bleeding continues to be minimal, pt went to bathroom and voided but after voiding she had a syncopal episode not seen by cnm, cnm uncertain if she hit her head or not; currently patient stating that she felt fine, no head CT ordered by CNM   I came to l/d unit and pt already on mother baby; I spoke with nursing staff about fall - witnessed by [employer] (nurse) - saw pt stand up in bathroom where she then fell forward and to the side, hitting her head on the shower wall and then landing on the floor diagonally into the shower -nursing then weighed chucks and sheets which then made EBL total 2278 Reviewed all findings with CNM in person and then also with nurses; CNM did not appreciate 740ml ebm from chucks and sheets, feels water from bath contributing to weight  Went to Mother Baby room with CNM Pt denies any h/a, doula and husband did not see pt hit her head, no bruising, side of face is tender with palpation; lochia scant since in pp room Pt did just have near-syncopal episode in bathroom when she went to void -- did not fall  Patient Vitals for the past 24 hrs:  BP Temp Temp src Pulse Resp SpO2 Weight  01/04/22 2227 122/81 98.1 F (36.7 C) Oral 89 20 100 % --  01/04/22 2140 123/77 98.7 F (37.1 C) Oral 96 18 100 % --  01/04/22 1818 (!) 113/55 98.2 F (36.8 C) Oral (!) 102 18 -- --  01/04/22 1801 118/72 97.6 F (36.4 C) Oral 99 18 -- --  01/04/22 1800 -- -- -- -- -- -- 78 kg   A/P: ppd0 s/p svd, PPH PPH - at least 1500 ml ebl but likely closer to 2065ml; pt has received 1 l ivf, has not hydrated much today and will plan  575ml bolus now then run at 162ml/hr; plan bedpan or if unable to use then will need catheter overnight and will reassess in am; bleeding has resolved since hemorrhage and continue methergine q 6; reviewed PPH, syncope, management, pt understands; plan stat cbc now - possible treatment options for anemia would be iv iron, possible blood transfusion if severe and pt symptomatic; follow closely Head trauma - doula and husband did not recall another nurse being in room that would have witnessed event, and not aware of pt hitting head, felt that she hit side and slid off toilet; verified with nursing staff again and confirmed that 2 nurses were in room, one saw pt hit head on wall, other had back turned to pt; with this event, feel head CT is needed, also discussed monitoring for delayed sx over next few days See cnm note for delivery event

## 2022-01-04 NOTE — Progress Notes (Addendum)
Discussed R/B/A of TXA and Pitocin for PPH. Patient consents to TXA. Fundus firm and bleeding scant. IVF infusing. Will start PO Methergine series after consult with Dr. Murrell Redden and patient agrees.  Suzan Nailer, MSN, CNM 01/04/2022, 9:11 PM

## 2022-01-04 NOTE — H&P (Signed)
OB ADMISSION/ HISTORY & PHYSICAL:  Admission Date: 01/04/2022  5:54 PM  Admit Diagnosis: Normal labor and delivery [O80]    Judy Ramsey is a 31 y.o. female G3P1011 at [redacted]w[redacted]d presenting for active labor. Reports contractions started at 1500 and are now 2-3 minutes apart. SVE in MAU 9cm with BBOW. Denies vaginal bleeding. Endorses + fetal movement. Husband, Madelaine Bhat, and doula, [place], present and supportive. Eagerly anticipating a baby girl with an undecided name.   Prenatal History: G3P1011   EDC: 12/25/2021 P[facility]atal care at [facility] since 28 weeks, transfer from [place] for waterbirth option  Primary: A. Yetta Barre, CNM  Prenatal course complicated by: Iron deficiency anemia, declined PO iron, taking beef liver supplement Post dates at [redacted]w[redacted]d, ANFTing reassuring, BPP 8/8 at 41 weeks  Prenatal Labs: ABO, Rh:   O POS Antibody:   Negative Rubella:   Immune RPR:   Non-reactive HBsAg:   Negative HIV:   Negative GBS:   Negative 1 hr Glucola : GDM self screen WNL Genetic Screening: Declined Ultrasound: normal XX anatomy, anterior placenta, AGA growth    Maternal Diabetes: No Genetic Screening: Declined Maternal Ultrasounds/Referrals: Normal Fetal Ultrasounds or other Referrals:  None Maternal Substance Abuse:  No Significant Maternal Medications:  None Significant Maternal Lab Results:  Group B Strep negative Other Comments:  None  Medical / Surgical History : Past medical history:  Past Medical History:  Diagnosis Date   Dysmenorrhea    Head injury    Iron deficiency anemia during pregnancy 03/30/2019   Taking OTC iron supplementation daily    Kidney stone complicating pregnancy    PROM (premature rupture of membranes) 05/17/2019   Supervision of normal first pregnancy, antepartum 03/29/2019    Nursing Staff Provider  Office Location  [facility] Dating  U/S  Language  English Anatomy US  02/04/2019 - normal   Flu Vaccine  Declined 03/30/2019 Genetic Screen  declined   TDaP  vaccine   Declined 03/30/2019 Hgb A1C or  GTT Third trimester 02/28/2019-61  Rhogam   n/a   LAB RESULTS   Feeding Plan Breast Blood Type O/--/-- (08/17 0000)   Contraception None Antibody Positive (08/17 0000)  Circumci    Past surgical history:  Past Surgical History:  Procedure Laterality Date   BUNIONECTOMY  2018   right foot    Family History:  Family History  Problem Relation Age of Onset   Hypertension Paternal Grandmother    Hypertension Mother     Social History:  reports that she has never smoked. She has never used smokeless tobacco. She reports that she does not drink alcohol and does not use drugs.  Allergies: Pumpkin flavor, Sulfa antibiotics, and Ultram [tramadol hcl]   Current Medications at time of admission:  Medications Prior to Admission  Medication Sig Dispense Refill Last Dose   acetaminophen (TYLENOL) 325 MG tablet Take 2 tablets (650 mg total) by mouth every 6 (six) hours as needed (for pain scale < 4). (Patient not taking: Reported on 06/22/2019) 30 tablet 0    ferrous sulfate 325 (65 FE) MG tablet Take 325 mg by mouth daily with breakfast.      ibuprofen (ADVIL) 600 MG tablet Take 1 tablet (600 mg total) by mouth every 6 (six) hours. (Patient not taking: Reported on 06/22/2019) 30 tablet 0    Prenatal Vit-Fe Fumarate-FA (PRENATAL MULTIVITAMIN) TABS tablet Take 1 tablet by mouth daily at 12 noon.       Review of Systems: Review of Systems  Cardiovascular:  Positive for leg swelling.  All other systems reviewed and are negative.  Physical Exam: Vital signs and nursing notes reviewed.  Patient Vitals for the past 24 hrs:  BP Temp Temp src Pulse Resp Weight  01/04/22 1818 (!) 113/55 98.2 F (36.8 C) Oral (!) 102 18 --  01/04/22 1801 118/72 97.6 F (36.4 C) Oral 99 18 --  01/04/22 1800 -- -- -- -- -- 78 kg    General: AAO x 3, NAD Heart: RRR Lungs:CTAB Abdomen: Gravid, NT Extremities: 2+ pedal edema SVE: Dilation: 9 Presentation: Vertex Exam by:: Hoy Fallert cnm   FHR: 145BPM, moderate variability TOCO: Contractions q 2-3 minutes  Labs:   No results for input(s): "WBC", "HGB", "HCT", "PLT" in the last 72 hours.  Assessment/Plan: 31 y.o. G3P1011 at [redacted]w[redacted]d, active labor, iron deficiency anemia  Fetal wellbeing - FHT category 1 EFW AGA 8-9lbs  Labor: Active labor, SVE 9cm, BBOW, desires waterbirth, prep waterbirth tub now  GBS negative Rubella immune Rh positive  Pain control: desires waterbirth, class complete Analgesia/anesthesia PRN  Anticipated MOD: NSVB  Plans to breastfeed. POC discussed with patient and support team, all questions answered.  Dr. Murrell Redden notified of admission/plan of care.  Suzan Nailer CNM, MSN 01/04/2022, 7:44 PM

## 2022-01-04 NOTE — Progress Notes (Signed)
Patient was sitting on the toilet for peri care. Expressed to CNM and myself prior to being escorted to the bathroom that she had no issues and wasn't feeling dizzy or lightheaded at the time. Beverly Hanks was present in the room with the patient and myself when the pt became dizzy and had a syncopal episode. She fell off the toilet into the shower area and hit face/head. Charge nurse, CNM and 3 other RN's were called to come in to assist with evaluating pt and helping to assist back to the bed for IV placement and to start fluids. Vital signs were taken and a fundal assessment was also done at that time and were both within normal limits. STAT CBC was ran as well. Head and facial area was examined as well no tenderness, swelling or pain noted. Pt was A/O x4. Pt started to feel better and was stable at this time. CNM was asked if she would like a head CT to be ordered and she stated no head CT at this time and to keep monitoring. Dr Murrell Redden notified as well.

## 2022-01-04 NOTE — Progress Notes (Incomplete)
Patient was sitting on the toilet for peri care. Expressed to CNM and myself prior to being escorted to the bathroom that she had no issues and wasn't feeling dizzy or lightheaded at the time. Beverly Hanks was present in the room with the patient and myself when the pt became dizzy and had a syncopal episode. She fell off the toilet into the shower area and hit face/head. Charge nurse, CNM and 3 other RN's were called to come in to assist with evaluating pt and helping to assist back to the bed for IV placement and to start fluids. Vital signs were taken and a fundal assessment was also done at that time. Head and facial area was examined too.

## 2022-01-04 NOTE — Progress Notes (Addendum)
Notified Gavin Potters CNM of pt's syncopal episode on toilet around 2120. Pt without complaints of dizziness until after sitting on toilet and starting to void. RN directly in from of pt and held pt upright sitting on toilet. Called for assistance and cool cloth to pt's face and neck. Family at bedside, NT and LPN from floor into room to assist. Pt assisted back to bed via stedy lift and RN and NT assistance. Pt reports feeling better, VSS, fundus firm 3 below umbilicus, small to scant lochia. Per Estill Bamberg, continue LR @ 175ml/hr, pt already agreeable to methergine series. Will continue to closely monitor.  Addendum: Syncopal episode witnessed by this RN occurred at 2220 not 2120.

## 2022-01-04 NOTE — MAU Note (Signed)
Judy Ramsey is a 31 y.o. at [redacted]w[redacted]d here in MAU reporting: contractions started at 1500. Unsure about bleeding or LOF. +FM  Onset of complaint: today  Pain score: 8/10  Vitals:   01/04/22 1801  BP: 118/72  Pulse: 99  Resp: 18  Temp: 97.6 F (36.4 C)     FHT: +FM, EFM applied in room  Lab orders placed from triage: none

## 2022-01-05 ENCOUNTER — Inpatient Hospital Stay (HOSPITAL_COMMUNITY): Payer: BC Managed Care – PPO

## 2022-01-05 LAB — CBC
HCT: 26.9 % — ABNORMAL LOW (ref 36.0–46.0)
Hemoglobin: 9.1 g/dL — ABNORMAL LOW (ref 12.0–15.0)
MCH: 28.7 pg (ref 26.0–34.0)
MCHC: 33.8 g/dL (ref 30.0–36.0)
MCV: 84.9 fL (ref 80.0–100.0)
Platelets: 193 10*3/uL (ref 150–400)
RBC: 3.17 MIL/uL — ABNORMAL LOW (ref 3.87–5.11)
RDW: 13 % (ref 11.5–15.5)
WBC: 13.7 10*3/uL — ABNORMAL HIGH (ref 4.0–10.5)
nRBC: 0 % (ref 0.0–0.2)

## 2022-01-05 LAB — RPR: RPR Ser Ql: NONREACTIVE

## 2022-01-05 MED ORDER — FERROUS SULFATE 325 (65 FE) MG PO TABS: 325.0000 mg | ORAL_TABLET | Freq: Every day | ORAL | Status: DC

## 2022-01-05 NOTE — Progress Notes (Signed)
   Pt with h/a, resolving and minimal; no other c/o Minimal lochia; pain controlled; voids w/o difficulty though has been on bed pan d/t syncopal/pre-syncopal episodes yesterday  Breastfeeding; tol po  Patient Vitals for the past 24 hrs:  BP Temp Temp src Pulse Resp SpO2 Weight  01/05/22 0652 122/85 99.1 F (37.3 C) Oral 73 18 100 % --  01/05/22 0253 123/83 98.1 F (36.7 C) Oral 83 18 97 % --  01/04/22 2227 122/81 98.1 F (36.7 C) Oral 89 20 100 % --  01/04/22 2140 123/77 98.7 F (37.1 C) Oral 96 18 100 % --  01/04/22 2100 121/69 -- -- (!) 106 -- -- --  01/04/22 1818 (!) 113/55 98.2 F (36.8 C) Oral (!) 102 18 -- --  01/04/22 1801 118/72 97.6 F (36.4 C) Oral 99 18 -- --  01/04/22 1800 -- -- -- -- -- -- 78 kg   A&ox3 Rrr Ctab Abd: soft,nt,nd; fundus firfm and below umb LE: no edema, nt bilat  Head CT: neg      Latest Ref Rng & Units 01/05/2022    5:16 AM 01/04/2022   11:17 PM 01/04/2022    7:54 PM  CBC  WBC 4.0 - 10.5 K/uL 13.7  16.8  19.9   Hemoglobin 12.0 - 15.0 g/dL 9.1  8.8  9.5   Hematocrit 36.0 - 46.0 % 26.9  25.5  28.6   Platelets 150 - 400 K/uL 193  209  266    A/P: ppd1 s/p svd with PPH Doing well - feeling much better; h/h stable, plan oral iron, good hydration; precautions; contin to monitor closely; declines iv iron at this time S/p fall, head trauma - no bruising, only minimal h/a at this time and nearly resolved; neg head CT, precautions given for next few days Anemia, abl - asymptomatic, plan oral iron daily Rh pos RI

## 2022-01-05 NOTE — Progress Notes (Signed)
Patient assisted to bathroom. NO dizzyness noted. She did great.

## 2022-01-05 NOTE — Lactation Note (Signed)
This note was copied from a baby's chart. Lactation Consultation Note  Patient Name: Judy Ramsey [id number] Date: 01/05/2022 Reason for consult: Initial assessment;Term;Infant weight loss (LGA greater than 9 lbs at birth, Birth Parent with hx of anemia in pregnancy and had PPH after delivery. infant with -4% weight loss.) Age:29 hours See flow sheet infant had 3 voids and 3 stools since birth. LC did not observe latch at this time, Per Birth Parent infant had recently finished breastfeeding for  total of 30 minutes and latched on both breast, prior to [facility] entering the room. Birth Parent is experienced with breastfeeding see Maternal data below. Per Birth Parent, infant is currently cluster feeding, Birth Parent will continue to breastfeed infant on demand, by cues, 8 to 12+ times within 24 hrs, STS.  Birth Parent knows after latching infant at the breast if she chooses she can hand express and offer infant extra volume of EBM.  LC discussed importance of maternal rest, hydration and diet. Birth Parent was made aware of O/P services, breastfeeding support groups, community resources, and our phone # for post-discharge questions.   Maternal Data Has patient been taught Hand Expression?: Yes Does the patient have breastfeeding experience prior to this delivery?: Yes How long did the patient breastfeed?: Per Birth Parent she BF 1st child for 2 years and 1 month, he is almost 3 years now.  Feeding Mother's Current Feeding Choice: Breast Milk  LATCH Score Latch: Grasps breast easily, tongue down, lips flanged, rhythmical sucking.  Audible Swallowing: A few with stimulation  Type of Nipple: Everted at rest and after stimulation  Comfort (Breast/Nipple): Soft / non-tender  Hold (Positioning): No assistance needed to correctly position infant at breast.  LATCH Score: 9   Lactation Tools Discussed/Used    Interventions Interventions: Breast feeding basics reviewed;Skin to skin;Hand  express;DEBP;Education;LC Services brochure  Discharge Pump: Personal (DEBP at home.)  Consult Status Consult Status: Follow-up Date: 01/06/22 Follow-up type: In-patient    Vicente Serene 01/05/2022, 8:36 PM

## 2022-01-05 NOTE — Progress Notes (Signed)
Dr. Murrell Redden updated on patients status.

## 2022-01-05 NOTE — Progress Notes (Signed)
Patient complains of pain on left side of head. PRN pain medication offered but patient declines. Patient states that she wants to try some icing it soon. Vital signs are stable. Patient declines dizziness or feeling lightheaded. Patient has been up in room without difficulty.

## 2022-01-05 NOTE — Progress Notes (Signed)
Patient still using bedban. Patient states headache is slightly better. But feels better than she did. [place] still to round on patient.

## 2022-01-06 MED ORDER — ACETAMINOPHEN 500 MG PO TABS: 1000.0000 mg | ORAL_TABLET | Freq: Four times a day (QID) | ORAL | 1 refills | Status: AC | PRN

## 2022-01-06 MED ORDER — IBUPROFEN 600 MG PO TABS: 600.0000 mg | ORAL_TABLET | Freq: Four times a day (QID) | ORAL | 0 refills | Status: AC

## 2022-01-06 NOTE — Discharge Summary (Signed)
OB Discharge Summary  Patient Name: Judy Ramsey DOB: [birth year] MRN: [id number]  Date of admission: 01/04/2022 Delivering MD: Dorisann Frames K   Date of discharge: 01/06/2022  Admitting diagnosis: Normal labor and delivery [O80] Intrauterine pregnancy: [redacted]w[redacted]d     Secondary diagnosis:Principal Problem:   Postpartum care following vaginal delivery 9/16 Active Problems:   Normal labor and delivery   SVD/Waterbirth   Iron deficiency anemia during pregnancy   PPH 1559cc  Additional problems:syncopal episode with head trauma     Discharge diagnosis: Term Pregnancy Delivered and PPH                                                                     Post partum procedures: CT scan head  Augmentation:  none  Complications: Hemorrhage>1022mL  Hospital course:  Onset of Labor With Vaginal Delivery      31 y.o. yo [id number] at [redacted]w[redacted]d was admitted in Active Labor on 01/04/2022. Patient had an uncomplicated labor course as follows:  Membrane Rupture Time/Date: 6:27 PM ,01/04/2022   Delivery Method:Vaginal, Spontaneous  Episiotomy: None  Lacerations:  None   Immediately PP during void attempt pt had a witnessed syncopal episode where she hit her head. This was following a PPH. Pt had initially refused IV which was later placed. She refused pitocin. See separate notes. PP she did well. She did have a head CT given the trauma, which was negative. Pt notes HA that comes and goes but not responsive to ibuprofen or tylenol.  Patient  otherwise had an uncomplicated postpartum course.  She is ambulating, tolerating a regular diet, passing flatus, and urinating well. Patient is discharged home in stable condition on 01/06/22.  Newborn Data: Birth date:01/04/2022  Birth time:6:31 PM  Gender:Female  Living status:Living  Apgars:8 ,9  Weight:4366 g   Physical exam  Vitals:   01/05/22 1058 01/05/22 1842 01/05/22 1948 01/06/22 0612  BP: 117/74 120/79 115/75 111/64  Pulse: 80 91 83 89  Resp:  18 20 18 18   Temp: 98 F (36.7 C) 98 F (36.7 C) 97.9 F (36.6 C) 98.8 F (37.1 C)  TempSrc:  Oral Oral Oral  SpO2: 99% 99% 100% 97%  Weight:       General: alert, cooperative, and no distress Lochia: appropriate Uterine Fundus: firm Incision: N/A DVT Evaluation: No evidence of DVT seen on physical exam. CN 2-12 intact  Labs: Lab Results  Component Value Date   WBC 13.7 (H) 01/05/2022   HGB 9.1 (L) 01/05/2022   HCT 26.9 (L) 01/05/2022   MCV 84.9 01/05/2022   PLT 193 01/05/2022       No data to display          Discharge instruction: per After Visit Summary and "Baby and Me Booklet".  After Visit Meds:  Allergies as of 01/06/2022       Reactions   Pumpkin Flavor Hives   Sulfa Antibiotics Hives   Ultram [tramadol Hcl] Hives        Medication List     STOP taking these medications    ferrous sulfate 325 (65 FE) MG tablet   prenatal multivitamin Tabs tablet       TAKE these medications    acetaminophen 500 MG tablet  Commonly known as: TYLENOL Take 2 tablets (1,000 mg total) by mouth every 6 (six) hours as needed (for pain scale < 4). What changed:  medication strength how much to take   ibuprofen 600 MG tablet Commonly known as: ADVIL Take 1 tablet (600 mg total) by mouth every 6 (six) hours.       Please resume your home iron pill- every other day. (Information above that states to stop taking your iron is incorrect)  Diet: routine diet  Activity: Advance as tolerated. Pelvic rest for 6 weeks.   Outpatient follow up:6 weeks Follow up Appt:No future appointments. Follow up visit: No follow-ups on file.  Postpartum contraception: Not Discussed  Newborn Data: Live born female  Birth Weight: 9 lb 10 oz (4366 g) APGAR: 98, 9  Newborn Delivery   Birth date/time: 01/04/2022 18:31:00 Delivery type: Vaginal, Spontaneous      Baby Feeding: Breast Disposition:home with mother   01/06/2022 Ala Dach, MD

## 2022-01-13 ENCOUNTER — Telehealth (HOSPITAL_COMMUNITY): Payer: Self-pay | Admitting: *Deleted

## 2022-01-13 NOTE — Telephone Encounter (Signed)
Attempted hospital discharge follow-up call. Left message for patient to return RN call with any questions or concerns. Erline Levine, RN, 01/13/22, [phone]

## 2022-05-14 ENCOUNTER — Ambulatory Visit
Admission: EM | Admit: 2022-05-14 | Discharge: 2022-05-14 | Disposition: A | Discharge status: Home / Self Care | Payer: BC Managed Care – PPO | Attending: Internal Medicine | Admitting: Internal Medicine

## 2022-05-14 DIAGNOSIS — R070 Pain in throat: Secondary | ICD-10-CM

## 2022-05-14 DIAGNOSIS — J069 Acute upper respiratory infection, unspecified: Secondary | ICD-10-CM

## 2022-05-14 LAB — POCT RAPID STREP A (OFFICE): Rapid Strep A Screen: NEGATIVE

## 2022-05-14 MED ORDER — CETIRIZINE HCL 5 MG PO TABS: 5.0000 mg | ORAL_TABLET | Freq: Every day | ORAL | 0 refills | Status: AC

## 2022-05-14 MED ORDER — PSEUDOEPHEDRINE HCL 30 MG PO TABS: 30.0000 mg | ORAL_TABLET | Freq: Three times a day (TID) | ORAL | 0 refills | Status: AC | PRN

## 2022-05-14 NOTE — ED Triage Notes (Signed)
Pt c/o sore throat day 2-NAD-steady gait

## 2022-05-14 NOTE — ED Provider Notes (Signed)
[facility]  Note:  This document was prepared using Systems analyst and may include unintentional dictation errors.  MRN: [id number] DOB: Oct 10, 1990  Subjective:   Judy Ramsey is a 32 y.o. female presenting for 2-day history of throat pain, sinus drainage, bilateral ear itching.  Patient wanted to make sure she did not have strep.  Reports that she has had frequent bouts of this.  She is breast-feeding.  No current facility-administered medications for this encounter.  Current Outpatient Medications:    acetaminophen (TYLENOL) 500 MG tablet, Take 2 tablets (1,000 mg total) by mouth every 6 (six) hours as needed (for pain scale < 4)., Disp: 30 tablet, Rfl: 1   ibuprofen (ADVIL) 600 MG tablet, Take 1 tablet (600 mg total) by mouth every 6 (six) hours., Disp: 30 tablet, Rfl: 0   Allergies  Allergen Reactions   Pumpkin Flavor Hives   Sulfa Antibiotics Hives   Ultram [Tramadol Hcl] Hives    Past Medical History:  Diagnosis Date   Dysmenorrhea    Head injury    Iron deficiency anemia during pregnancy 03/30/2019   Taking OTC iron supplementation daily    Kidney stone complicating pregnancy    PROM (premature rupture of membranes) 05/17/2019   Supervision of normal first pregnancy, antepartum 03/29/2019    Nursing Staff Provider  Office Location  [facility] Dating  U/S  Language  English Anatomy US  02/04/2019 - normal   Flu Vaccine  Declined 03/30/2019 Genetic Screen  declined   TDaP vaccine   Declined 03/30/2019 Hgb A1C or  GTT Third trimester 02/28/2019-61  Rhogam   n/a   LAB RESULTS   Feeding Plan Breast Blood Type O/--/-- (08/17 0000)   Contraception None Antibody Positive (08/17 0000)  Circumci     Past Surgical History:  Procedure Laterality Date   BUNIONECTOMY  2018   right foot    Family History  Problem Relation Age of Onset   Hypertension Paternal Grandmother    Hypertension Mother     Social History   Tobacco Use    Smoking status: Never   Smokeless tobacco: Never  Vaping Use   Vaping Use: Never used  Substance Use Topics   Alcohol use: No   Drug use: No    ROS   Objective:   Vitals: BP 118/64 (BP Location: Left Arm)   Pulse 95   Temp 98.1 F (36.7 C) (Oral)   Resp 16   LMP 03/30/2022   SpO2 99%   Breastfeeding Yes   Physical Exam Constitutional:      General: She is not in acute distress.    Appearance: Normal appearance. She is well-developed and normal weight. She is not ill-appearing, toxic-appearing or diaphoretic.  HENT:     Head: Normocephalic and atraumatic.     Right Ear: Tympanic membrane, ear canal and external ear normal. No drainage or tenderness. No middle ear effusion. There is no impacted cerumen. Tympanic membrane is not erythematous or bulging.     Left Ear: Tympanic membrane, ear canal and external ear normal. No drainage or tenderness.  No middle ear effusion. There is no impacted cerumen. Tympanic membrane is not erythematous or bulging.     Nose: Nose normal. No congestion or rhinorrhea.     Mouth/Throat:     Mouth: Mucous membranes are moist. No oral lesions.     Pharynx: No pharyngeal swelling, oropharyngeal exudate, posterior oropharyngeal erythema or uvula swelling.     Tonsils: No tonsillar  exudate or tonsillar abscesses. 0 on the right. 0 on the left.     Comments: Cobblestone pattern post-nasal drainage.  Eyes:     General: No scleral icterus.       Right eye: No discharge.        Left eye: No discharge.     Extraocular Movements: Extraocular movements intact.     Right eye: Normal extraocular motion.     Left eye: Normal extraocular motion.     Conjunctiva/sclera: Conjunctivae normal.  Cardiovascular:     Rate and Rhythm: Normal rate.  Pulmonary:     Effort: Pulmonary effort is normal.  Musculoskeletal:     Cervical back: Normal range of motion and neck supple.  Lymphadenopathy:     Cervical: No cervical adenopathy.  Skin:    General: Skin is  warm and dry.  Neurological:     General: No focal deficit present.     Mental Status: She is alert and oriented to person, place, and time.  Psychiatric:        Mood and Affect: Mood normal.        Behavior: Behavior normal.    Results for orders placed or performed during the hospital encounter of 05/14/22 (from the past 24 hour(s))  POCT rapid strep A     Status: None   Collection Time: 05/14/22  4:05 PM  Result Value Ref Range   Rapid Strep A Screen Negative Negative    Assessment and Plan :   PDMP not reviewed this encounter.  1. Viral upper respiratory infection   2. Throat pain     Strep culture pending.  Recommend supportive care for an acute viral pharyngitis. Counseled patient on potential for adverse effects with medications prescribed/recommended today, ER and return-to-clinic precautions discussed, patient verbalized understanding.    Jaynee Eagles, [place] 05/14/22 1645

## 2022-05-17 LAB — CULTURE, GROUP A STREP (THRC)

## 2024-02-11 ENCOUNTER — Other Ambulatory Visit: Payer: Self-pay | Admitting: Certified Nurse Midwife

## 2024-02-11 ENCOUNTER — Other Ambulatory Visit (HOSPITAL_COMMUNITY): Payer: Self-pay | Admitting: Certified Nurse Midwife

## 2024-02-11 DIAGNOSIS — O99019 Anemia complicating pregnancy, unspecified trimester: Secondary | ICD-10-CM

## 2024-02-18 ENCOUNTER — Telehealth (HOSPITAL_COMMUNITY): Payer: Self-pay

## 2024-02-18 NOTE — Telephone Encounter (Signed)
 Patient receiving [employer] Patient Assistance   ID: ID: [id number]    Auth Submission: FREE DRUG Site of care: Site of care: [facility] Payer: None Medication & CPT/J Code(s) submitted: Venofer (Iron Sucrose) J1756 Diagnosis Code: O99.019/D50.9 Free Drug (Manufacturer: [employer]) Units/visits requested: 300mg  x 2 doses Approval from: 01/20/24 to 01/19/25

## 2024-03-14 ENCOUNTER — Ambulatory Visit (HOSPITAL_COMMUNITY)
Admission: RE | Admit: 2024-03-14 | Discharge: 2024-03-14 | Disposition: A | Discharge status: Home / Self Care | Payer: Self-pay | Source: Ambulatory Visit | Attending: Certified Nurse Midwife | Admitting: Certified Nurse Midwife

## 2024-03-14 ENCOUNTER — Encounter (HOSPITAL_COMMUNITY): Payer: Self-pay | Admitting: Certified Nurse Midwife

## 2024-03-14 VITALS — BP 102/63 | HR 83 | Temp 98.2°F | Resp 17

## 2024-03-14 DIAGNOSIS — O9903 Anemia complicating the puerperium: Secondary | ICD-10-CM | POA: Insufficient documentation

## 2024-03-14 DIAGNOSIS — D509 Iron deficiency anemia, unspecified: Secondary | ICD-10-CM | POA: Insufficient documentation

## 2024-03-14 DIAGNOSIS — O99019 Anemia complicating pregnancy, unspecified trimester: Secondary | ICD-10-CM | POA: Insufficient documentation

## 2024-03-14 MED ORDER — IRON SUCROSE 300 MG IVPB - SIMPLE MED
300.0000 mg | Freq: Once | Status: AC
  Administered 2024-03-14: 300 mg via INTRAVENOUS
  Filled 2024-03-14: qty 300

## 2024-03-21 ENCOUNTER — Ambulatory Visit (HOSPITAL_COMMUNITY)
Admission: RE | Admit: 2024-03-21 | Discharge: 2024-03-21 | Disposition: A | Payer: Self-pay | Source: Ambulatory Visit | Attending: Certified Nurse Midwife

## 2024-03-21 VITALS — BP 105/59 | HR 90 | Temp 97.9°F | Resp 16

## 2024-03-21 DIAGNOSIS — Z3A30 30 weeks gestation of pregnancy: Secondary | ICD-10-CM | POA: Insufficient documentation

## 2024-03-21 DIAGNOSIS — D509 Iron deficiency anemia, unspecified: Secondary | ICD-10-CM | POA: Insufficient documentation

## 2024-03-21 DIAGNOSIS — O99019 Anemia complicating pregnancy, unspecified trimester: Secondary | ICD-10-CM | POA: Insufficient documentation

## 2024-03-21 MED ORDER — IRON SUCROSE 300 MG IVPB - SIMPLE MED
300.0000 mg | Freq: Once | Status: AC
  Administered 2024-03-21: 300 mg via INTRAVENOUS
  Filled 2024-03-21: qty 300
